# Patient Record
Sex: Male | Born: 1935 | Race: White | Hispanic: No | Marital: Married | Smoking: Never smoker
Health system: Southern US, Community
[De-identification: ages and names within clinical notes are randomized; demographics above are authoritative.]

## PROBLEM LIST (undated history)

## (undated) DIAGNOSIS — I5040 Unspecified combined systolic (congestive) and diastolic (congestive) heart failure: Secondary | ICD-10-CM

## (undated) DIAGNOSIS — R42 Dizziness and giddiness: Secondary | ICD-10-CM

## (undated) DIAGNOSIS — C9110 Chronic lymphocytic leukemia of B-cell type not having achieved remission: Secondary | ICD-10-CM

## (undated) DIAGNOSIS — E785 Hyperlipidemia, unspecified: Secondary | ICD-10-CM

## (undated) DIAGNOSIS — R809 Proteinuria, unspecified: Secondary | ICD-10-CM

## (undated) DIAGNOSIS — I351 Nonrheumatic aortic (valve) insufficiency: Secondary | ICD-10-CM

## (undated) DIAGNOSIS — M1990 Unspecified osteoarthritis, unspecified site: Secondary | ICD-10-CM

## (undated) DIAGNOSIS — C44601 Unspecified malignant neoplasm of skin of unspecified upper limb, including shoulder: Secondary | ICD-10-CM

## (undated) DIAGNOSIS — I714 Abdominal aortic aneurysm, without rupture, unspecified: Secondary | ICD-10-CM

## (undated) DIAGNOSIS — R109 Unspecified abdominal pain: Secondary | ICD-10-CM

## (undated) DIAGNOSIS — Z0000 Encounter for general adult medical examination without abnormal findings: Secondary | ICD-10-CM

## (undated) DIAGNOSIS — E291 Testicular hypofunction: Secondary | ICD-10-CM

## (undated) DIAGNOSIS — Z87438 Personal history of other diseases of male genital organs: Secondary | ICD-10-CM

## (undated) DIAGNOSIS — N183 Chronic kidney disease, stage 3 unspecified: Secondary | ICD-10-CM

## (undated) DIAGNOSIS — Z950 Presence of cardiac pacemaker: Secondary | ICD-10-CM

## (undated) DIAGNOSIS — I440 Atrioventricular block, first degree: Secondary | ICD-10-CM

## (undated) DIAGNOSIS — R197 Diarrhea, unspecified: Secondary | ICD-10-CM

## (undated) DIAGNOSIS — B349 Viral infection, unspecified: Secondary | ICD-10-CM

## (undated) DIAGNOSIS — N289 Disorder of kidney and ureter, unspecified: Secondary | ICD-10-CM

## (undated) DIAGNOSIS — I447 Left bundle-branch block, unspecified: Secondary | ICD-10-CM

## (undated) DIAGNOSIS — I4891 Unspecified atrial fibrillation: Secondary | ICD-10-CM

## (undated) DIAGNOSIS — C44300 Unspecified malignant neoplasm of skin of unspecified part of face: Secondary | ICD-10-CM

## (undated) DIAGNOSIS — E0500 Thyrotoxicosis with diffuse goiter without thyrotoxic crisis or storm: Secondary | ICD-10-CM

## (undated) DIAGNOSIS — R259 Unspecified abnormal involuntary movements: Secondary | ICD-10-CM

## (undated) DIAGNOSIS — G4700 Insomnia, unspecified: Secondary | ICD-10-CM

## (undated) DIAGNOSIS — M8580 Other specified disorders of bone density and structure, unspecified site: Secondary | ICD-10-CM

## (undated) DIAGNOSIS — I10 Essential (primary) hypertension: Secondary | ICD-10-CM

## (undated) DIAGNOSIS — R5383 Other fatigue: Secondary | ICD-10-CM

## (undated) DIAGNOSIS — R011 Cardiac murmur, unspecified: Secondary | ICD-10-CM

## (undated) HISTORY — DX: Unspecified atrial fibrillation: I48.91

## (undated) HISTORY — PX: OTHER SURGICAL HISTORY: SHX169

## (undated) HISTORY — PX: HERNIA REPAIR: SHX51

## (undated) HISTORY — DX: Presence of cardiac pacemaker: Z95.0

## (undated) HISTORY — PX: EYE SURGERY: SHX253

## (undated) HISTORY — DX: Unspecified combined systolic (congestive) and diastolic (congestive) heart failure: I50.40

---

## 2001-01-10 ENCOUNTER — Other Ambulatory Visit: Admission: RE | Admit: 2001-01-10 | Discharge: 2001-01-10 | Payer: Self-pay | Admitting: Oncology

## 2005-03-01 ENCOUNTER — Ambulatory Visit: Payer: Self-pay | Admitting: Oncology

## 2005-07-05 ENCOUNTER — Encounter: Admission: RE | Admit: 2005-07-05 | Discharge: 2005-07-05 | Payer: Self-pay | Admitting: Nephrology

## 2005-07-13 ENCOUNTER — Encounter: Admission: RE | Admit: 2005-07-13 | Discharge: 2005-07-13 | Payer: Self-pay | Admitting: Nephrology

## 2006-02-28 ENCOUNTER — Ambulatory Visit: Payer: Self-pay | Admitting: Oncology

## 2006-03-01 LAB — CBC WITH DIFFERENTIAL/PLATELET
Basophils Absolute: 0 10*3/uL (ref 0.0–0.1)
EOS%: 0.8 % (ref 0.0–7.0)
HGB: 11.9 g/dL — ABNORMAL LOW (ref 13.0–17.1)
MCH: 34.8 pg — ABNORMAL HIGH (ref 28.0–33.4)
MCV: 103.4 fL — ABNORMAL HIGH (ref 81.6–98.0)
MONO%: 3.4 % (ref 0.0–13.0)
RBC: 3.42 10*6/uL — ABNORMAL LOW (ref 4.20–5.71)
RDW: 13.5 % (ref 11.2–14.6)

## 2007-02-26 ENCOUNTER — Ambulatory Visit: Payer: Self-pay | Admitting: Oncology

## 2007-02-28 LAB — CBC WITH DIFFERENTIAL/PLATELET
BASO%: 0.3 % (ref 0.0–2.0)
EOS%: 0.8 % (ref 0.0–7.0)
Eosinophils Absolute: 0.2 10*3/uL (ref 0.0–0.5)
MCHC: 34.6 g/dL (ref 32.0–35.9)
MCV: 102.9 fL — ABNORMAL HIGH (ref 81.6–98.0)
MONO%: 3.7 % (ref 0.0–13.0)
NEUT#: 3 10*3/uL (ref 1.5–6.5)
RBC: 3.47 10*6/uL — ABNORMAL LOW (ref 4.20–5.71)
RDW: 13.4 % (ref 11.2–14.6)

## 2007-08-27 ENCOUNTER — Ambulatory Visit: Payer: Self-pay | Admitting: Oncology

## 2007-08-29 LAB — CBC & DIFF AND RETIC
BASO%: 0.2 % (ref 0.0–2.0)
Eosinophils Absolute: 0.1 10*3/uL (ref 0.0–0.5)
IRF: 0.38 (ref 0.070–0.380)
MONO#: 0.9 10*3/uL (ref 0.1–0.9)
NEUT#: 3.2 10*3/uL (ref 1.5–6.5)
Platelets: 150 10*3/uL (ref 145–400)
RBC: 3.61 10*6/uL — ABNORMAL LOW (ref 4.20–5.71)
RDW: 13.3 % (ref 11.2–14.6)
RETIC #: 49.8 10*3/uL (ref 31.8–103.9)
Retic %: 1.4 % (ref 0.7–2.3)
WBC: 24.3 10*3/uL — ABNORMAL HIGH (ref 4.0–10.0)
lymph#: 20 10*3/uL — ABNORMAL HIGH (ref 0.9–3.3)

## 2007-08-30 LAB — DIRECT ANTIGLOBULIN TEST (NOT AT ARMC)
DAT (Complement): NEGATIVE
DAT IgG: NEGATIVE

## 2007-11-25 ENCOUNTER — Ambulatory Visit: Payer: Self-pay | Admitting: Vascular Surgery

## 2008-04-20 ENCOUNTER — Ambulatory Visit (HOSPITAL_COMMUNITY): Admission: RE | Admit: 2008-04-20 | Discharge: 2008-04-20 | Payer: Self-pay | Admitting: Internal Medicine

## 2008-04-22 ENCOUNTER — Ambulatory Visit: Payer: Self-pay | Admitting: Oncology

## 2008-04-23 ENCOUNTER — Inpatient Hospital Stay (HOSPITAL_COMMUNITY): Admission: AD | Admit: 2008-04-23 | Discharge: 2008-04-25 | Payer: Self-pay | Admitting: Urology

## 2008-05-04 ENCOUNTER — Ambulatory Visit (HOSPITAL_BASED_OUTPATIENT_CLINIC_OR_DEPARTMENT_OTHER): Admission: RE | Admit: 2008-05-04 | Discharge: 2008-05-04 | Payer: Self-pay | Admitting: Urology

## 2008-06-07 ENCOUNTER — Ambulatory Visit: Payer: Self-pay | Admitting: Oncology

## 2008-06-08 LAB — CBC & DIFF AND RETIC
EOS%: 0.8 % (ref 0.0–7.0)
Eosinophils Absolute: 0.2 10*3/uL (ref 0.0–0.5)
LYMPH%: 79.6 % — ABNORMAL HIGH (ref 14.0–48.0)
MCH: 35.6 pg — ABNORMAL HIGH (ref 28.0–33.4)
MCHC: 33.8 g/dL (ref 32.0–35.9)
MCV: 105.4 fL — ABNORMAL HIGH (ref 81.6–98.0)
MONO%: 3.3 % (ref 0.0–13.0)
Platelets: 140 10*3/uL — ABNORMAL LOW (ref 145–400)
RBC: 3.4 10*6/uL — ABNORMAL LOW (ref 4.20–5.71)
Retic %: 1.5 % (ref 0.7–2.3)

## 2009-03-04 ENCOUNTER — Ambulatory Visit: Payer: Self-pay | Admitting: Oncology

## 2009-03-08 LAB — CBC WITH DIFFERENTIAL/PLATELET
BASO%: 0.1 % (ref 0.0–2.0)
Basophils Absolute: 0 10*3/uL (ref 0.0–0.1)
EOS%: 0.4 % (ref 0.0–7.0)
HCT: 35.6 % — ABNORMAL LOW (ref 38.4–49.9)
HGB: 12 g/dL — ABNORMAL LOW (ref 13.0–17.1)
LYMPH%: 85.2 % — ABNORMAL HIGH (ref 14.0–49.0)
MCH: 35.8 pg — ABNORMAL HIGH (ref 27.2–33.4)
MCHC: 33.7 g/dL (ref 32.0–36.0)
MCV: 106.1 fL — ABNORMAL HIGH (ref 79.3–98.0)
MONO%: 0.8 % (ref 0.0–14.0)
NEUT%: 13.5 % — ABNORMAL LOW (ref 39.0–75.0)

## 2009-12-07 ENCOUNTER — Ambulatory Visit: Payer: Self-pay | Admitting: Oncology

## 2009-12-09 ENCOUNTER — Encounter: Admission: RE | Admit: 2009-12-09 | Discharge: 2009-12-09 | Payer: Self-pay | Admitting: Cardiology

## 2009-12-14 ENCOUNTER — Ambulatory Visit: Payer: Self-pay | Admitting: Vascular Surgery

## 2010-02-04 ENCOUNTER — Ambulatory Visit: Payer: Self-pay | Admitting: Oncology

## 2010-02-08 LAB — CBC WITH DIFFERENTIAL/PLATELET
Eosinophils Absolute: 0.2 10*3/uL (ref 0.0–0.5)
HCT: 35.6 % — ABNORMAL LOW (ref 38.4–49.9)
LYMPH%: 83 % — ABNORMAL HIGH (ref 14.0–49.0)
MONO#: 0.7 10*3/uL (ref 0.1–0.9)
NEUT#: 3.3 10*3/uL (ref 1.5–6.5)
NEUT%: 13.4 % — ABNORMAL LOW (ref 39.0–75.0)
Platelets: 126 10*3/uL — ABNORMAL LOW (ref 140–400)
WBC: 24.9 10*3/uL — ABNORMAL HIGH (ref 4.0–10.3)
lymph#: 20.7 10*3/uL — ABNORMAL HIGH (ref 0.9–3.3)

## 2010-07-02 ENCOUNTER — Emergency Department (HOSPITAL_COMMUNITY): Admission: EM | Admit: 2010-07-02 | Discharge: 2010-07-03 | Payer: Self-pay | Admitting: Emergency Medicine

## 2010-09-12 ENCOUNTER — Ambulatory Visit
Admission: AD | Admit: 2010-09-12 | Discharge: 2010-09-12 | Payer: Self-pay | Source: Home / Self Care | Admitting: Internal Medicine

## 2010-11-29 ENCOUNTER — Ambulatory Visit (INDEPENDENT_AMBULATORY_CARE_PROVIDER_SITE_OTHER): Payer: Medicare PPO | Admitting: Vascular Surgery

## 2010-11-29 DIAGNOSIS — I714 Abdominal aortic aneurysm, without rupture: Secondary | ICD-10-CM

## 2010-12-02 NOTE — Consult Note (Signed)
NEW PATIENT CONSULTATION  Brian Underwood, Brian Underwood DOB:  1918/02/23                                       11/29/2010 CHART#:[id number]  Patient presents today for continued discussion regarding his infrarenal abdominal aortic aneurysm.  I had first met patient in 2006 when he had an incidental finding of a 3.5 cm infrarenal abdominal aortic aneurysm. He has had serial ultrasound follow-ups in our office.  He has had some continued growth over time, and today he is here for ultrasound follow- up.  Maximal diameter today is 5.0 cm.  He has had no symptoms referable to this.  He continues to be in excellent health despite his age of 72. He does not have any cardiac difficulty.  Does have a history of a cardiac murmur.  He is hypertensive under control.  He does not smoke.  SOCIAL HISTORY:  He is a retired Surveyor, minerals.  He does not smoke and is active.  FAMILY HISTORY:  Significant for a brain aneurysm with his brother.  REVIEW OF SYSTEMS:  Positive for a history of diverticulitis, history of renal stones, otherwise negative except for HPI.  PHYSICAL EXAMINATION:  A well-developed and well-nourished white male appearing his stated age in no acute distress.  Blood pressure is 161/67, pulse 51, respirations 12.  HEENT:  Normal.  Chest:  Clear bilateral without rales, rhonchi or wheezes.  Heart:  Regular rate and rhythm.  I do not appreciate a murmur.  He has 2+ radial, 2+ femoral, 2+ popliteal, and 2+ posterior tibial pulses.  Carotid arteries without bruits bilaterally.  Abdominal exam reveals a prominent aortic pulsation with no tenderness and no other masses.  Musculoskeletal shows no major deformity or cyanosis.  Neurologic:  No focal weakness or paresthesias. Skin without ulcers or rashes.  He did undergo an ultrasound today with a maximal diameter of 5.0 cm. He told me that he did have a CT scan in November for evaluation of renal stones.  I have reviewed these  images.  This did show a 5.20 cm infrarenal aortic aneurysm as well.  This was a not a CT angiogram.  He does have some calcifications.  It is difficult to determine his accurate length below the level of the renal arteries.  He may be a stent graft candidate.  He does have significant tortuosity of his iliac vessels as well.  I had a long discussion with patient, explaining the significance of his asymptomatic infrarenal aneurysm.  I have recommend he continue observation only.  We will see him at 69-month intervals with ultrasound. He is quite active despite his advanced age and would recommend consideration for repair if he reaches 5.5 to 6 cm.    Larina Earthly, M.D. Electronically Signed  TFE/MEDQ  D:  11/29/2010  T:  11/30/2010  Job:  [id number]  cc:   Loraine Leriche A. Perini, M.D.

## 2010-12-02 NOTE — Procedures (Unsigned)
DUPLEX ULTRASOUND OF ABDOMINAL AORTA  INDICATION:  Follow up abdominal aortic aneurysm.  HISTORY: Diabetes:  No. Cardiac:  Murmur. Hypertension:  Yes. Smoking:  No. Connective Tissue Disorder: Family History:  Brother had cerebral aneurysm. Previous Surgery:  No.  DUPLEX EXAM:         AP (cm)                   TRANSVERSE (cm) Proximal             2.27 cm Mid                  3.41 cm                   3.48 cm Distal               4.87 cm                   5.07 cm Right Iliac          1.63 cm Left Iliac           1.09 cm  PREVIOUS:  Date: 12/14/2009  AP:  4.31  TRANSVERSE:  4.18  IMPRESSION: 1. Abdominal aortic aneurysm with largest measurement of 4.87 cm X     5.07 cm, which is increased from the previous study. 2. Right iliac appears ectatic compared to left. 3. Aorta and common iliac artery velocities appear within normal     limits. 4. Incidental note:  Stable cystic structure on left measuring 8.51 cm     X 7.67 cm; however, unable to determine if single structure or     multiple. 5. Aorta and iliacs appear somewhat tortuous.    ___________________________________________ Larina Earthly, M.D.  AS/MEDQ  D:  11/29/2010  T:  11/29/2010  Job:  [id number]  cc:   Loraine Leriche A. Perini, M.D.

## 2011-02-16 ENCOUNTER — Emergency Department (HOSPITAL_COMMUNITY): Payer: Medicare PPO

## 2011-02-16 ENCOUNTER — Emergency Department (HOSPITAL_COMMUNITY)
Admission: EM | Admit: 2011-02-16 | Discharge: 2011-02-16 | Disposition: A | Discharge status: Home / Self Care | Payer: Medicare PPO | Attending: Emergency Medicine | Admitting: Emergency Medicine

## 2011-02-16 DIAGNOSIS — E7800 Pure hypercholesterolemia, unspecified: Secondary | ICD-10-CM | POA: Insufficient documentation

## 2011-02-16 DIAGNOSIS — D72829 Elevated white blood cell count, unspecified: Secondary | ICD-10-CM | POA: Insufficient documentation

## 2011-02-16 DIAGNOSIS — R109 Unspecified abdominal pain: Secondary | ICD-10-CM | POA: Insufficient documentation

## 2011-02-16 DIAGNOSIS — R9431 Abnormal electrocardiogram [ECG] [EKG]: Secondary | ICD-10-CM | POA: Insufficient documentation

## 2011-02-16 DIAGNOSIS — I714 Abdominal aortic aneurysm, without rupture, unspecified: Secondary | ICD-10-CM | POA: Insufficient documentation

## 2011-02-16 DIAGNOSIS — N200 Calculus of kidney: Secondary | ICD-10-CM | POA: Insufficient documentation

## 2011-02-16 DIAGNOSIS — I10 Essential (primary) hypertension: Secondary | ICD-10-CM | POA: Insufficient documentation

## 2011-02-16 DIAGNOSIS — Q619 Cystic kidney disease, unspecified: Secondary | ICD-10-CM | POA: Insufficient documentation

## 2011-02-16 DIAGNOSIS — N19 Unspecified kidney failure: Secondary | ICD-10-CM | POA: Insufficient documentation

## 2011-02-16 LAB — CBC
MCH: 35.5 pg — ABNORMAL HIGH (ref 26.0–34.0)
MCV: 104.1 fL — ABNORMAL HIGH (ref 78.0–100.0)
Platelets: 129 10*3/uL — ABNORMAL LOW (ref 150–400)
RBC: 3.38 MIL/uL — ABNORMAL LOW (ref 4.22–5.81)
RDW: 13.4 % (ref 11.5–15.5)

## 2011-02-16 LAB — COMPREHENSIVE METABOLIC PANEL
AST: 29 U/L (ref 0–37)
Albumin: 3.5 g/dL (ref 3.5–5.2)
BUN: 67 mg/dL — ABNORMAL HIGH (ref 6–23)
Calcium: 8.5 mg/dL (ref 8.4–10.5)
Chloride: 108 mEq/L (ref 96–112)
Creatinine, Ser: 2.3 mg/dL — ABNORMAL HIGH (ref 0.4–1.5)
GFR calc Af Amer: 32 mL/min — ABNORMAL LOW (ref >60)
Total Protein: 6 g/dL (ref 6.0–8.3)

## 2011-02-16 LAB — PATHOLOGIST SMEAR REVIEW

## 2011-02-16 LAB — DIFFERENTIAL
Basophils Absolute: 0 10*3/uL (ref 0.0–0.1)
Basophils Relative: 0 % (ref 0–1)
Eosinophils Absolute: 0.4 10*3/uL (ref 0.0–0.7)
Eosinophils Relative: 2 % (ref 0–5)
Metamyelocytes Relative: 0 %
Monocytes Absolute: 1.6 10*3/uL — ABNORMAL HIGH (ref 0.1–1.0)
Monocytes Relative: 8 % (ref 3–12)
Myelocytes: 0 %
Neutro Abs: 6.4 10*3/uL (ref 1.7–7.7)
Neutrophils Relative %: 31 % — ABNORMAL LOW (ref 43–77)

## 2011-02-16 LAB — URINE MICROSCOPIC-ADD ON

## 2011-02-16 LAB — URINALYSIS, ROUTINE W REFLEX MICROSCOPIC
Glucose, UA: NEGATIVE mg/dL
Hgb urine dipstick: NEGATIVE
Specific Gravity, Urine: 1.027 (ref 1.005–1.030)
pH: 5 (ref 5.0–8.0)

## 2011-02-16 LAB — POCT CARDIAC MARKERS
CKMB, poc: <1 ng/mL — ABNORMAL LOW (ref 1.0–8.0)
Troponin i, poc: <0.05 ng/mL (ref 0.00–0.09)

## 2011-02-17 LAB — URINE CULTURE
Colony Count: 15000
Culture  Setup Time: 201204261704

## 2011-03-07 NOTE — Procedures (Signed)
DUPLEX ULTRASOUND OF ABDOMINAL AORTA   INDICATION:  Followup, abdominal aortic aneurysm.   HISTORY:  Diabetes:  No.  Cardiac:  History of heart murmur.  Hypertension:  Yes, on medication.  Smoking:  No.  Connective Tissue Disorder:  Family History:  Brother was diagnosed with a cerebral aneurysm.  Previous Surgery:  No.   DUPLEX EXAM:         AP (cm)                   TRANSVERSE (cm)  Proximal             3.39 Cm                   3.63 cm  Mid                  3.74 cm                   3.88 cm  Distal               3.07 cm                   3.03 cm  Right Iliac          1.58 cm  Left Iliac           1.19 Cm   PREVIOUS:  Date: 08/31/06  AP:  3.52  TRANSVERSE:  3.75   IMPRESSION:  Slight increase in measurements of known abdominal aortic  aneurysm; however, aorta is tortuous, which could account for larger  measurements.   A preliminary copy was faxed to Dr. Laurey Morale office.   ___________________________________________  Larina Earthly, M.D.   DP/MEDQ  D:  11/25/2007  T:  11/25/2007  Job:  [id number]

## 2011-03-07 NOTE — Discharge Summary (Signed)
NAME:  Brian, Underwood                 ACCOUNT NO.:  [id number]   MEDICAL RECORD NO.:  [id number]          PATIENT TYPE:  INP   LOCATION:  1429                         FACILITY:  [facility]   PHYSICIAN:  Heloise Purpura, MD      DATE OF BIRTH:  [birth year]   DATE OF ADMISSION:  04/22/2008  DATE OF DISCHARGE:  04/25/2008                               DISCHARGE SUMMARY   ADMISSION DIAGNOSIS:  Right ureteral calculus.   DISCHARGE DIAGNOSIS:  Right ureteral calculus.   HISTORY AND PHYSICAL:  For full details, please see admission history  and physical.  Briefly, [name] is an 77 year old gentleman who  presented with right flank pain with radiation to his right upper  quadrant.  He was found to have a 5 mm right ureteral calculus.  Due to  persistent pain, as well as the development of renal insufficiency, it  was decided to proceed with treatment.  He was taken to the operating  room by Dr. Logan Bores and underwent attempted ureteroscopic removal of his  stone.  However, his stone could not be well visualized and ureteroscopy  proved to be difficult due to the small caliber of his ureter.  Therefore, a ureteral stent was placed.  The patient's serum creatinine  at that time was found to be 2.5.  He was monitored with subsequent  resolution of his renal insufficiency over the next 2 days.  By April 25, 2008, his creatinine had reach 1.3, which was consistent with his  baseline.  He also had leukocytosis with a white blood count of  approximately 20,000.  He was seen by his primary care physician, Dr.  Rodrigo Ran while hospitalized.  Dr. Waynard Edwards made note that the patient's  baseline white blood count was typically 17-20,000, related to his  chronic lymphocytic leukemia.  In addition, a differential on his CBC  was performed which showed predominantly lymphocytes.  This was  consistent with his CLL.  He remained afebrile and was felt to be stable  for discharge on April 25, 2008, and it was decided to  send him home on  fluoroquinolone antibiotics with a urine culture pending.   DISPOSITION:  Home.   DISCHARGE MEDICATIONS:  He was instructed to resume his regular home  medications.  In addition, he was given a prescription to take Vicodin  as needed for pain and given a prescription to begin Cipro 500 mg twice  daily for 1 week.  His also given a prescription to take Ditropan as  needed for bladder spasms related to his ureteral stent.   DISCHARGE INSTRUCTIONS:  He was instructed to call should he develop  high fever or worsening pain.   FOLLOW UP:  [name] did request to follow up with myself for further  management of his kidney stone.  He, therefore will be set up for follow-  up later this week to check a KUB and to follow up on the final culture  results from his urine culture in the hospital.  We will then proceed  with definitive management depending on the location of his  stone at  that time, as well as whether it is radiopaque.      Heloise Purpura, MD  Electronically Signed    LB/MEDQ  D:  04/25/2008  T:  04/25/2008  Job:  [id number]

## 2011-03-07 NOTE — Procedures (Signed)
DUPLEX ULTRASOUND OF ABDOMINAL AORTA   INDICATION:  Follow up abdominal aortic aneurysm.   HISTORY:  Diabetes:  No.  Cardiac:  Murmur.  Hypertension:  Yes.  Smoking:  No.  Connective Tissue Disorder:  Family History:  Brother died of cerebral aneurysm.  Previous Surgery:   DUPLEX EXAM:         AP (cm)                   TRANSVERSE (cm)  Proximal             3.21 cm                   3.33 cm  Mid                  4.31 cm                   4.18 cm  Distal               3.85 cm                   3.76 cm  Right Iliac          Not well visualized       Not well visualized  Left Iliac           Not well visualized       Not well visualized   PREVIOUS:  4.1 cm   IMPRESSION:  1. Abdominal aortic aneurysm noted with the largest measurements of      4.31 X 4.18 cm.  2. Multiple cystic structures (fluid collections) noted in the left      lower quadrant, measuring 8.27 cm X 7.50 cm and the second one      measuring 8.41 cm X 8.79 cm.  3. A cyst noted in the upper pole of the left kidney.   ___________________________________________  Quita Skye Hart [place], M.D.   MG/MEDQ  D:  12/14/2009  T:  12/15/2009  Job:  [id number]

## 2011-03-07 NOTE — Op Note (Signed)
NAME:  Brian Underwood, Brian Underwood                 ACCOUNT NO.:  [id number]   MEDICAL RECORD NO.:  [id number]          PATIENT TYPE:  OIB   LOCATION:  1610                         FACILITY:  [facility]   PHYSICIAN:  Jamison Neighbor, M.D.  DATE OF BIRTH:  1918-04-22   DATE OF PROCEDURE:  04/22/2008  DATE OF DISCHARGE:                               OPERATIVE REPORT   PREOPERATIVE DIAGNOSIS:  Right ureteral calculus.   POSTOPERATIVE DIAGNOSIS:  Right ureteral calculus.   PROCEDURE:  Cystoscopy, right retrograde, right ureteroscopy, right  double-J catheter.   SURGEON:  Jamison Neighbor, M.D.   ANESTHESIA:  General.   COMPLICATIONS:  None.   DRAINS:  A 6-French x 22-cm double-J catheter.   BRIEF HISTORY:  This 77 year old male was felt to have a 5 x 6-mm stone  in the right proximal ureter.  The patient has a lot of pain and would  like to have this removed.  He was offered ESWL but preferred a more  immediate treatment.  He understands that if the stone could not be  removed he will have a stent placed in preparation for eventual ESWL.  He gave full informed consent.   PROCEDURE:  After successful induction of general anesthesia, the  patient was placed in the dorsal lithotomy position, prepped with  Betadine and draped in the usual sterile fashion.  Cystoscopy was  performed. Urethra was visualized in its entirety and found to be  normal.  There was minimal prostatic enlargement and no real bladder  obstruction.  The bladder was carefully inspected.  No tumors or stones  could be seen.  Both ureteral orifices were normal in configuration and  location.  No stone was seen in the area of the right ureter.  The  retrograde study performed on the right-hand side was difficult to  interpret.  The ureter appeared dilated up into the proximal ureter, but  there was a lot of contrast from a CT scan that made it very difficult  to see the stone.  The contrast left over from the CT pretty much  obscured  the collecting system.  A guidewire was left up within the  collecting system.  Ureteroscope was advanced.  It was advanced all the  way as far as it would go, and the stone could not be identified.  The  ureter was noted to be dilated.  Incision was made to try and pass the  flexible ureteroscope further.  A ureteral access sheath was utilized.  A second wire was placed.  Ureteral access sheath was passed a second  time so that once safety wire was left outside.  The wire on the inside  of the sheath was used to pass a ureteroscope up towards the kidney.  This got into the area of the very proximal ureter, but stone was not  identified.  There was an area or two of clot, and there were some areas  that were difficult to evaluate.  It is possible a stone is trapped in  that area, but clear-cut stone could not be identified.  The  ureteroscope  was used to visualize the entire ureter, was withdrawn, and  no  stone could be identified.  The stone has either passed or it has passed  up into the kidney.  This will require additional x-ray for evaluation.  A double-J catheter will be left in place.  A follow-up image will be  obtained to see if there is a stone present.  The patient will be ESL if  the stone is still identifiable.      Jamison Neighbor, M.D.  Electronically Signed     RJE/MEDQ  D:  04/22/2008  T:  04/22/2008  Job:  [id number]

## 2011-03-07 NOTE — Op Note (Signed)
NAME:  Brian Underwood, Brian Underwood                 ACCOUNT NO.:  [id number]   MEDICAL RECORD NO.:  [id number]          PATIENT TYPE:  AMB   LOCATION:  NESC                         FACILITY:  [facility]   PHYSICIAN:  Heloise Purpura, MD      DATE OF BIRTH:  Aug 12, 1918   DATE OF PROCEDURE:  05/04/2008  DATE OF DISCHARGE:                               OPERATIVE REPORT   PREOPERATIVE DIAGNOSIS:  Right ureteral calculus.   POSTOPERATIVE DIAGNOSIS:  Right renal calculus.   PROCEDURES:  1. Cystoscopy.  2. Right retrograde pyelography.  3. Right ureteroscopy with laser lithotripsy and stone removal.  4. Right ureteral stent placement (6 x 26).   SURGEON:  Heloise Purpura, MD.   ANESTHESIA:  General anesthesia.   COMPLICATIONS:  None.   INDICATIONS FOR PROCEDURE:  Mr. Surette is an 77 year old gentleman who  recently presented with right-sided flank pain and an elevated  creatinine.  He underwent an attempted ureteroscopic stone removal  procedure which was unsuccessful.  However, a ureteral stent was placed  with subsequent normalization of his creatinine.  He now presents for  definitive stone management.  He recently underwent a KUB which  demonstrated a questionable ureteral calculus overlying the pelvis in  the mid ureter.  He also had a large calcification over the right renal  contour measuring approximately 1.7 cm which was inconsistent with a 6  mm stone noted on his prior CT scan.  After discussion regarding  potential risks, benefits, alternative options and the potential  complications associated with the above procedures, he elected to  proceed and informed consent was obtained.   DESCRIPTION OF PROCEDURE:  The patient was taken to the operating room  and a general anesthetic was administered.  He was given preoperative  antibiotics, placed in the dorsal lithotomy position and prepped and  draped in the usual sterile fashion.  Next, a preoperative time-out was  performed.  Cystourethroscopy was  then performed which demonstrated the  patient's indwelling right ureteral stent.  No other bladder tumors,  stones, or other mucosal pathology was identified.  The right ureteral  stent was then brought out to the urethral meatus and a 0.038 Sensor  guidewire was advanced up the stent and into the right renal pelvis  under fluoroscopic guidance.  A 6 French ureteral catheter was then  advanced over the wire and a retrograde pyelogram was performed.  No  obvious filling defects were noted within the ureter, nor the right  renal pelvis.  Certainly, no large filling defects were identified that  would be consistent with a 1.7 cm renal calculus.  At this point, the  wire was replaced and semi-rigid ureteroscopy was performed up to and  above the iliac vessels.  No identifiable stone was visualized.  Therefore, a second 0.038 Glidewire was advanced up into the renal  pelvis and the digital flexible ureteroscope was advanced up into the  kidney.  All calyces were visualized and the patient's solitary 6 mm  stone was identified.  No other calculi were seen.  This stone was  grasped with a Nitanol basket and  brought down into the distal ureter.  Due to the size of the stone, it was felt that it would be unlikely to  be safely brought out the ureterovesical junction.  Therefore, the stone  was released and fragmented with the Holmium laser.  Once adequately  fragmented, the fragments were removed with the Nitanol basket until all  sizeable fragments were removed.  A 6 x 26 double J ureteral stent was  then advanced over the wire using Seldinger technique and appropriately  positioned in the renal pelvis and bladder under fluoroscopic guidance.  The distal curl was confirmed under cystoscopic guidance and the  patient's bladder was emptied.  The procedure was ended.  He tolerated  the procedure well without complications.  He was able to be awakened  and transferred to the recovery unit in  satisfactory condition.      Heloise Purpura, MD  Electronically Signed     LB/MEDQ  D:  05/04/2008  T:  05/04/2008  Job:  [id number]

## 2011-03-09 ENCOUNTER — Encounter (HOSPITAL_BASED_OUTPATIENT_CLINIC_OR_DEPARTMENT_OTHER): Payer: Medicare PPO | Admitting: Oncology

## 2011-03-09 ENCOUNTER — Other Ambulatory Visit: Payer: Self-pay | Admitting: Oncology

## 2011-03-09 DIAGNOSIS — C9110 Chronic lymphocytic leukemia of B-cell type not having achieved remission: Secondary | ICD-10-CM

## 2011-03-09 DIAGNOSIS — D649 Anemia, unspecified: Secondary | ICD-10-CM

## 2011-03-09 LAB — CBC WITH DIFFERENTIAL/PLATELET
Eosinophils Absolute: 0.2 10*3/uL (ref 0.0–0.5)
HCT: 32.5 % — ABNORMAL LOW (ref 38.4–49.9)
LYMPH%: 83.3 % — ABNORMAL HIGH (ref 14.0–49.0)
MCHC: 33.4 g/dL (ref 32.0–36.0)
MCV: 107.3 fL — ABNORMAL HIGH (ref 79.3–98.0)
MONO#: 0.5 10*3/uL (ref 0.1–0.9)
MONO%: 2.7 % (ref 0.0–14.0)
NEUT#: 2.4 10*3/uL (ref 1.5–6.5)
NEUT%: 12.7 % — ABNORMAL LOW (ref 39.0–75.0)
Platelets: 118 10*3/uL — ABNORMAL LOW (ref 140–400)
RBC: 3.03 10*6/uL — ABNORMAL LOW (ref 4.20–5.82)
WBC: 18.8 10*3/uL — ABNORMAL HIGH (ref 4.0–10.3)

## 2011-05-31 ENCOUNTER — Encounter (INDEPENDENT_AMBULATORY_CARE_PROVIDER_SITE_OTHER): Payer: Medicare PPO

## 2011-05-31 DIAGNOSIS — I719 Aortic aneurysm of unspecified site, without rupture: Secondary | ICD-10-CM

## 2011-06-16 NOTE — Procedures (Unsigned)
DUPLEX ULTRASOUND OF ABDOMINAL AORTA  INDICATION:  Abdominal aortic aneurysm.  HISTORY: Diabetes:  No. Cardiac:  Murmur. Hypertension:  Yes. Smoking:  No. Connective Tissue Disorder: Family History:  Brother had cerebral aneurysm. Previous Surgery:  No.  DUPLEX EXAM:         AP (cm)                   TRANSVERSE (cm) Proximal             3.1 cm                    2.7 cm Mid                  3.2 cm                    3.4 cm Distal               5.1 cm                    4.7 cm Right Iliac          1.5 cm                    1.5 cm Left Iliac           1.6 cm                    1.6 cm  PREVIOUS:  Date: 11/29/2010  AP:  4.87  TRANSVERSE:  5.07  IMPRESSION: 1. Aneurysmal dilatation of the abdominal aorta with no significant     change in maximal diameter when compared to the previous     examination. 2. A tortuous abdominal aorta and bilateral common iliac arteries is     noted. 3. Incidental finding:  Two cystic structures, with no internal flow,     noted anteriorly to the inferior vena cava and abdominal aorta     measuring roughly 9 cm each.  ___________________________________________ Larina Earthly, M.D.  CH/MEDQ  D:  05/31/2011  T:  05/31/2011  Job:  [id number]

## 2011-07-20 LAB — BASIC METABOLIC PANEL
BUN: 21
BUN: 21
BUN: 31 — ABNORMAL HIGH
BUN: 38 — ABNORMAL HIGH
CO2: 25
Calcium: 8.5
Calcium: 9.3
Chloride: 108
Chloride: 108
Creatinine, Ser: 1.31
Creatinine, Ser: 1.33
Creatinine, Ser: 2.54 — ABNORMAL HIGH
GFR calc Af Amer: 35 — ABNORMAL LOW
GFR calc non Af Amer: 24 — ABNORMAL LOW
GFR calc non Af Amer: 29 — ABNORMAL LOW
GFR calc non Af Amer: 52 — ABNORMAL LOW
Glucose, Bld: 101 — ABNORMAL HIGH
Glucose, Bld: 104 — ABNORMAL HIGH
Glucose, Bld: 106 — ABNORMAL HIGH
Glucose, Bld: 111 — ABNORMAL HIGH
Glucose, Bld: 94
Potassium: 4
Potassium: 4.3
Potassium: 4.4
Potassium: 4.4
Sodium: 137
Sodium: 141

## 2011-07-20 LAB — CBC
HCT: 29.4 — ABNORMAL LOW
HCT: 29.6 — ABNORMAL LOW
HCT: 30.4 — ABNORMAL LOW
Hemoglobin: 10 — ABNORMAL LOW
Hemoglobin: 10.8 — ABNORMAL LOW
Hemoglobin: 9.8 — ABNORMAL LOW
MCHC: 33.7
MCHC: 33.7
MCV: 104.8 — ABNORMAL HIGH
MCV: 105.1 — ABNORMAL HIGH
Platelets: 110 — ABNORMAL LOW
Platelets: 129 — ABNORMAL LOW
RBC: 2.77 — ABNORMAL LOW
RDW: 13.5
RDW: 13.7
RDW: 13.9
RDW: 13.9
WBC: 19.7 — ABNORMAL HIGH

## 2011-07-20 LAB — URINALYSIS, ROUTINE W REFLEX MICROSCOPIC
Bilirubin Urine: NEGATIVE
Ketones, ur: NEGATIVE
Nitrite: NEGATIVE
Specific Gravity, Urine: 1.016
Urobilinogen, UA: 0.2
pH: 5.5

## 2011-07-20 LAB — URINE CULTURE
Culture: NO GROWTH
Special Requests: NEGATIVE

## 2011-07-20 LAB — DIFFERENTIAL
Basophils Absolute: 0
Basophils Absolute: 0.1
Eosinophils Absolute: 0.2
Lymphs Abs: 15.7 — ABNORMAL HIGH
Lymphs Abs: 16.9 — ABNORMAL HIGH
Monocytes Absolute: 0.5
Monocytes Absolute: 0.6
Neutro Abs: 2.7
Neutro Abs: 2.9

## 2011-07-20 LAB — URINE MICROSCOPIC-ADD ON

## 2011-07-20 LAB — POCT I-STAT 4, (NA,K, GLUC, HGB,HCT): Glucose, Bld: 103 — ABNORMAL HIGH

## 2011-07-28 ENCOUNTER — Other Ambulatory Visit: Payer: Self-pay | Admitting: Oncology

## 2011-07-28 ENCOUNTER — Encounter (HOSPITAL_BASED_OUTPATIENT_CLINIC_OR_DEPARTMENT_OTHER): Payer: Medicare PPO | Admitting: Oncology

## 2011-07-28 DIAGNOSIS — C9110 Chronic lymphocytic leukemia of B-cell type not having achieved remission: Secondary | ICD-10-CM

## 2011-07-28 LAB — CBC WITH DIFFERENTIAL/PLATELET
Basophils Absolute: 0.1 10*3/uL (ref 0.0–0.1)
Eosinophils Absolute: 0.2 10*3/uL (ref 0.0–0.5)
HGB: 12.6 g/dL — ABNORMAL LOW (ref 13.0–17.1)
LYMPH%: 78.6 % — ABNORMAL HIGH (ref 14.0–49.0)
MONO#: 0.5 10*3/uL (ref 0.1–0.9)
NEUT#: 4.7 10*3/uL (ref 1.5–6.5)
Platelets: 123 10*3/uL — ABNORMAL LOW (ref 140–400)
RBC: 3.62 10*6/uL — ABNORMAL LOW (ref 4.20–5.82)
WBC: 25.7 10*3/uL — ABNORMAL HIGH (ref 4.0–10.3)
nRBC: 0 % (ref 0–0)

## 2011-09-23 ENCOUNTER — Telehealth: Payer: Self-pay | Admitting: Oncology

## 2011-09-23 NOTE — Telephone Encounter (Signed)
S/w the pt's wife regarding the may 2013 appts

## 2011-11-15 ENCOUNTER — Encounter (HOSPITAL_COMMUNITY): Payer: Self-pay | Admitting: *Deleted

## 2011-11-15 ENCOUNTER — Other Ambulatory Visit: Payer: Self-pay

## 2011-11-15 ENCOUNTER — Inpatient Hospital Stay (HOSPITAL_COMMUNITY)
Admission: EM | Admit: 2011-11-15 | Discharge: 2011-11-19 | DRG: 309 | Disposition: A | Discharge status: Home / Self Care | Payer: Medicare PPO | Attending: Internal Medicine | Admitting: Internal Medicine

## 2011-11-15 ENCOUNTER — Emergency Department (HOSPITAL_COMMUNITY): Payer: Medicare PPO

## 2011-11-15 DIAGNOSIS — I5030 Unspecified diastolic (congestive) heart failure: Secondary | ICD-10-CM | POA: Diagnosis present

## 2011-11-15 DIAGNOSIS — I351 Nonrheumatic aortic (valve) insufficiency: Secondary | ICD-10-CM | POA: Diagnosis present

## 2011-11-15 DIAGNOSIS — I2789 Other specified pulmonary heart diseases: Secondary | ICD-10-CM | POA: Diagnosis present

## 2011-11-15 DIAGNOSIS — I359 Nonrheumatic aortic valve disorder, unspecified: Secondary | ICD-10-CM | POA: Diagnosis present

## 2011-11-15 DIAGNOSIS — I509 Heart failure, unspecified: Secondary | ICD-10-CM | POA: Diagnosis present

## 2011-11-15 DIAGNOSIS — I4891 Unspecified atrial fibrillation: Principal | ICD-10-CM | POA: Diagnosis present

## 2011-11-15 DIAGNOSIS — K5730 Diverticulosis of large intestine without perforation or abscess without bleeding: Secondary | ICD-10-CM | POA: Diagnosis present

## 2011-11-15 DIAGNOSIS — IMO0001 Reserved for inherently not codable concepts without codable children: Secondary | ICD-10-CM | POA: Diagnosis present

## 2011-11-15 DIAGNOSIS — I10 Essential (primary) hypertension: Secondary | ICD-10-CM | POA: Diagnosis present

## 2011-11-15 DIAGNOSIS — I447 Left bundle-branch block, unspecified: Secondary | ICD-10-CM | POA: Diagnosis present

## 2011-11-15 DIAGNOSIS — I714 Abdominal aortic aneurysm, without rupture, unspecified: Secondary | ICD-10-CM | POA: Diagnosis present

## 2011-11-15 DIAGNOSIS — I498 Other specified cardiac arrhythmias: Secondary | ICD-10-CM | POA: Diagnosis present

## 2011-11-15 DIAGNOSIS — N183 Chronic kidney disease, stage 3 unspecified: Secondary | ICD-10-CM | POA: Diagnosis present

## 2011-11-15 DIAGNOSIS — N200 Calculus of kidney: Secondary | ICD-10-CM | POA: Diagnosis present

## 2011-11-15 DIAGNOSIS — R109 Unspecified abdominal pain: Secondary | ICD-10-CM | POA: Diagnosis present

## 2011-11-15 DIAGNOSIS — C9110 Chronic lymphocytic leukemia of B-cell type not having achieved remission: Secondary | ICD-10-CM | POA: Diagnosis present

## 2011-11-15 DIAGNOSIS — N400 Enlarged prostate without lower urinary tract symptoms: Secondary | ICD-10-CM | POA: Diagnosis present

## 2011-11-15 DIAGNOSIS — R001 Bradycardia, unspecified: Secondary | ICD-10-CM | POA: Diagnosis not present

## 2011-11-15 DIAGNOSIS — K5790 Diverticulosis of intestine, part unspecified, without perforation or abscess without bleeding: Secondary | ICD-10-CM | POA: Diagnosis present

## 2011-11-15 DIAGNOSIS — I129 Hypertensive chronic kidney disease with stage 1 through stage 4 chronic kidney disease, or unspecified chronic kidney disease: Secondary | ICD-10-CM | POA: Diagnosis present

## 2011-11-15 DIAGNOSIS — E0500 Thyrotoxicosis with diffuse goiter without thyrotoxic crisis or storm: Secondary | ICD-10-CM | POA: Diagnosis present

## 2011-11-15 HISTORY — DX: Proteinuria, unspecified: R80.9

## 2011-11-15 HISTORY — DX: Essential (primary) hypertension: I10

## 2011-11-15 HISTORY — DX: Thyrotoxicosis with diffuse goiter without thyrotoxic crisis or storm: E05.00

## 2011-11-15 HISTORY — DX: Viral infection, unspecified: B34.9

## 2011-11-15 HISTORY — DX: Encounter for general adult medical examination without abnormal findings: Z00.00

## 2011-11-15 HISTORY — DX: Unspecified malignant neoplasm of skin of unspecified upper limb, including shoulder: C44.601

## 2011-11-15 HISTORY — DX: Left bundle-branch block, unspecified: I44.7

## 2011-11-15 HISTORY — DX: Insomnia, unspecified: G47.00

## 2011-11-15 HISTORY — DX: Chronic kidney disease, stage 3 unspecified: N18.30

## 2011-11-15 HISTORY — DX: Chronic lymphocytic leukemia of B-cell type not having achieved remission: C91.10

## 2011-11-15 HISTORY — DX: Diarrhea, unspecified: R19.7

## 2011-11-15 HISTORY — DX: Nonrheumatic aortic (valve) insufficiency: I35.1

## 2011-11-15 HISTORY — DX: Abdominal aortic aneurysm, without rupture, unspecified: I71.40

## 2011-11-15 HISTORY — DX: Abdominal aortic aneurysm, without rupture: I71.4

## 2011-11-15 HISTORY — DX: Other fatigue: R53.83

## 2011-11-15 HISTORY — DX: Atrioventricular block, first degree: I44.0

## 2011-11-15 HISTORY — DX: Chronic kidney disease, stage 3 (moderate): N18.3

## 2011-11-15 HISTORY — DX: Other specified disorders of bone density and structure, unspecified site: M85.80

## 2011-11-15 HISTORY — DX: Unspecified abnormal involuntary movements: R25.9

## 2011-11-15 HISTORY — DX: Cardiac murmur, unspecified: R01.1

## 2011-11-15 HISTORY — DX: Testicular hypofunction: E29.1

## 2011-11-15 HISTORY — DX: Personal history of other diseases of male genital organs: Z87.438

## 2011-11-15 HISTORY — DX: Unspecified osteoarthritis, unspecified site: M19.90

## 2011-11-15 HISTORY — DX: Disorder of kidney and ureter, unspecified: N28.9

## 2011-11-15 HISTORY — DX: Dizziness and giddiness: R42

## 2011-11-15 HISTORY — DX: Unspecified abdominal pain: R10.9

## 2011-11-15 HISTORY — DX: Unspecified malignant neoplasm of skin of unspecified part of face: C44.300

## 2011-11-15 HISTORY — DX: Hyperlipidemia, unspecified: E78.5

## 2011-11-15 LAB — MRSA PCR SCREENING: MRSA by PCR: NEGATIVE

## 2011-11-15 LAB — URINALYSIS, ROUTINE W REFLEX MICROSCOPIC
Bilirubin Urine: NEGATIVE
Glucose, UA: NEGATIVE mg/dL
Hgb urine dipstick: NEGATIVE
Ketones, ur: NEGATIVE mg/dL
Leukocytes, UA: NEGATIVE
Nitrite: NEGATIVE
Protein, ur: 30 mg/dL — AB
Specific Gravity, Urine: 1.021 (ref 1.005–1.030)
Urobilinogen, UA: 0.2 mg/dL (ref 0.0–1.0)
pH: 6 (ref 5.0–8.0)

## 2011-11-15 LAB — COMPREHENSIVE METABOLIC PANEL
Alkaline Phosphatase: 74 U/L (ref 39–117)
BUN: 37 mg/dL — ABNORMAL HIGH (ref 6–23)
Chloride: 106 mEq/L (ref 96–112)
GFR calc Af Amer: 41 mL/min — ABNORMAL LOW (ref >90)
GFR calc non Af Amer: 36 mL/min — ABNORMAL LOW (ref >90)
Glucose, Bld: 90 mg/dL (ref 70–99)
Potassium: 4.3 mEq/L (ref 3.5–5.1)
Total Bilirubin: 0.4 mg/dL (ref 0.3–1.2)

## 2011-11-15 LAB — CBC
HCT: 39.5 % (ref 39.0–52.0)
Hemoglobin: 13 g/dL (ref 13.0–17.0)
WBC: 18.3 10*3/uL — ABNORMAL HIGH (ref 4.0–10.5)

## 2011-11-15 LAB — TROPONIN I
Troponin I: <0.3 ng/mL (ref <0.30)
Troponin I: <0.3 ng/mL (ref <0.30)

## 2011-11-15 LAB — URINE MICROSCOPIC-ADD ON

## 2011-11-15 LAB — DIFFERENTIAL
Basophils Absolute: 0 10*3/uL (ref 0.0–0.1)
Lymphocytes Relative: 78 % — ABNORMAL HIGH (ref 12–46)
Monocytes Absolute: 0.4 10*3/uL (ref 0.1–1.0)
Monocytes Relative: 2 % — ABNORMAL LOW (ref 3–12)
Neutro Abs: 3.7 10*3/uL (ref 1.7–7.7)

## 2011-11-15 LAB — MAGNESIUM: Magnesium: 1.9 mg/dL (ref 1.5–2.5)

## 2011-11-15 MED ORDER — AMLODIPINE BESYLATE 2.5 MG PO TABS
2.5000 mg | ORAL_TABLET | ORAL | Status: AC
  Administered 2011-11-15: 2.5 mg via ORAL
  Filled 2011-11-15: qty 1

## 2011-11-15 MED ORDER — SODIUM CHLORIDE 0.9 % IJ SOLN
3.0000 mL | Freq: Two times a day (BID) | INTRAMUSCULAR | Status: DC
  Administered 2011-11-15 – 2011-11-19 (×8): 3 mL via INTRAVENOUS

## 2011-11-15 MED ORDER — POTASSIUM CHLORIDE ER 10 MEQ PO TBCR
10.0000 meq | EXTENDED_RELEASE_TABLET | Freq: Two times a day (BID) | ORAL | Status: DC
  Administered 2011-11-16 – 2011-11-19 (×8): 10 meq via ORAL
  Filled 2011-11-15 (×11): qty 1

## 2011-11-15 MED ORDER — ZOLPIDEM TARTRATE 5 MG PO TABS: 5.0000 mg | ORAL_TABLET | Freq: Every evening | ORAL | Status: DC | PRN

## 2011-11-15 MED ORDER — VITAMIN D3 25 MCG (1000 UNIT) PO TABS
2000.0000 [IU] | ORAL_TABLET | Freq: Every day | ORAL | Status: DC
  Administered 2011-11-16 – 2011-11-19 (×4): 2000 [IU] via ORAL
  Filled 2011-11-15 (×4): qty 2

## 2011-11-15 MED ORDER — CHLORTHALIDONE 25 MG PO TABS
25.0000 mg | ORAL_TABLET | Freq: Every day | ORAL | Status: DC
Start: 2011-11-16 — End: 2011-11-19
  Administered 2011-11-17 – 2011-11-19 (×3): 25 mg via ORAL
  Filled 2011-11-15 (×4): qty 1

## 2011-11-15 MED ORDER — ACETAMINOPHEN 325 MG PO TABS
650.0000 mg | ORAL_TABLET | Freq: Four times a day (QID) | ORAL | Status: DC | PRN
  Administered 2011-11-15 – 2011-11-19 (×9): 650 mg via ORAL
  Filled 2011-11-15 (×2): qty 2
  Filled 2011-11-15: qty 1
  Filled 2011-11-15 (×6): qty 2

## 2011-11-15 MED ORDER — AMLODIPINE BESYLATE 2.5 MG PO TABS
2.5000 mg | ORAL_TABLET | Freq: Every day | ORAL | Status: DC
  Administered 2011-11-17 – 2011-11-19 (×3): 2.5 mg via ORAL
  Filled 2011-11-15 (×4): qty 1

## 2011-11-15 MED ORDER — EZETIMIBE-SIMVASTATIN 10-20 MG PO TABS
0.5000 | ORAL_TABLET | Freq: Every day | ORAL | Status: DC
  Administered 2011-11-16 – 2011-11-18 (×3): 0.5 via ORAL
  Filled 2011-11-15 (×7): qty 0.5

## 2011-11-15 NOTE — Consult Note (Signed)
Reason for Consult: Atrial Fibrilation  Requesting Physician: Dr Waynard Edwards  HPI: This is a 78 y.o. male with a past medical history significant for chronic left bundle branch block and sinus bradycardia. There is no history of coronary disease. He has had a history of vascular disease, he has an abdominal aortic aneurysm that is followed by Dr. Arbie Cookey. His last abdominal ultrasound showed his aneurysm to be 5.1 x 4.7 cm. The patient says he has a history of valvular heart disease, he has not had an echocardiogram in over 4 years. He presented to Dr. Laurey Morale office today complaining of abdominal pain and vague shortness of breath. EKG at Dr. Laurey Morale office showed atrial fibrillation with a ventricular response of about 60 with chronic left bundle branch block and some PVCs. He was sent to the emergency room for further evaluation of his abdominal pain. We're asked to see him in consult regarding a new onset of atrial fibrillation. The last EKG we could find showing sinus rhythm was in April of 2012. He seems to be relatively asymptomatic regarding his atrial fibrillation, he denies any chest pain or palpitations. He is very active for his age, he still works. He walks up 18 steps every day to his office without significant problem.  PMHx:  Past Medical History  Diagnosis Date  . Renal disorder   . Abnormal involuntary movements   . Viral infection   . Hypogonadism male   . Fatigue   . Orthostatic lightheadedness   . Acute diarrhea   . Abdominal pain of unknown etiology   . Osteopenia   . Microalbuminuria   . Left bundle branch block   . Atrioventricular block, first degree   . Aortic insufficiency   . Abdominal aortic aneurysm   . History of benign prostatic hypertrophy   . Chronic renal disease, stage III   . Graves disease   . Hyperlipidemia   . Insomnia   . Health maintenance examination   . CLL (chronic lymphoblastic leukemia)    Past Surgical History  Procedure Date  . Hernia  repair   . Right foot     FAMHx: Family History  Problem Relation Age of Onset  . Stroke Mother   . Stroke Father   . Cancer Brother     SOCHx:  reports that he has never smoked. He does not have any smokeless tobacco history on file. He reports that he drinks alcohol. 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  ALLERGIES: Allergies  Allergen Reactions  . Altace   . Aspirin   . Lexapro     ROS: no chest pain, no palpatations, no syncope. Pos for abd pain, skin cancers, CLL. H/O heart murrmur. No fevers, chills or night sweats.  HOME MEDICATIONS:  (Not in a hospital admission)  HOSPITAL MEDICATIONS: Prior to Admission:  (Not in a hospital admission)  VITALS: Blood pressure 163/64, pulse 54, temperature 97.8 F (36.6 C), temperature source Oral, resp. rate 23, SpO2 96.00%.  PHYSICAL EXAM: General appearance: alert, cooperative and no distress Neck: no adenopathy, no carotid bruit, no JVD, supple, symmetrical, trachea midline and thyroid not enlarged, symmetric, no tenderness/mass/nodules Lungs: decreased Heart: irregularly irregular rhythm and 1/6 syst murmur Abdomen: non tender Extremities: venous stasis dermatitis noted, trace edema Pulses: 2+ and symmetric Skin: Skin color, texture, turgor normal. No rashes or lesions or skin cancers Neurologic: Grossly normal  LABS: Results for orders placed during the hospital encounter of 11/15/11 (from the past 48  hour(s))  CBC     Status: Abnormal   Collection Time   11/15/11  4:44 PM      Component Value Range Comment   WBC 18.3 (*) 4.0 - 10.5 (K/uL)    RBC 3.70 (*) 4.22 - 5.81 (MIL/uL)    Hemoglobin 13.0  13.0 - 17.0 (g/dL)    HCT 62.9  52.8 - 41.3 (%)    MCV 106.8 (*) 78.0 - 100.0 (fL)    MCH 35.1 (*) 26.0 - 34.0 (pg)    MCHC 32.9  30.0 - 36.0 (g/dL)    RDW 24.4  01.0 - 27.2 (%)    Platelets 95 (*) 150 - 400 (K/uL) PLATELET COUNT CONFIRMED BY SMEAR  COMPREHENSIVE METABOLIC  PANEL     Status: Abnormal   Collection Time   11/15/11  4:44 PM      Component Value Range Comment   Sodium 142  135 - 145 (mEq/L)    Potassium 4.3  3.5 - 5.1 (mEq/L)    Chloride 106  96 - 112 (mEq/L)    CO2 26  19 - 32 (mEq/L)    Glucose, Bld 90  70 - 99 (mg/dL)    BUN 37 (*) 6 - 23 (mg/dL)    Creatinine, Ser 5.36 (*) 0.50 - 1.35 (mg/dL)    Calcium 8.8  8.4 - 10.5 (mg/dL)    Total Protein 6.1  6.0 - 8.3 (g/dL)    Albumin 3.4 (*) 3.5 - 5.2 (g/dL)    AST 39 (*) 0 - 37 (U/L)    ALT 32  0 - 53 (U/L)    Alkaline Phosphatase 74  39 - 117 (U/L)    Total Bilirubin 0.4  0.3 - 1.2 (mg/dL)    GFR calc non Af Amer 36 (*) >90 (mL/min)    GFR calc Af Amer 41 (*) >90 (mL/min)   TROPONIN I     Status: Normal   Collection Time   11/15/11  4:44 PM      Component Value Range Comment   Troponin I <0.30  <0.30 (ng/mL)   DIFFERENTIAL     Status: Abnormal   Collection Time   11/15/11  4:44 PM      Component Value Range Comment   Neutrophils Relative 19 (*) 43 - 77 (%)    Neutro Abs 3.7  1.7 - 7.7 (K/uL)    Lymphocytes Relative 78 (*) 12 - 46 (%)    Lymphs Abs 14.7 (*) 0.7 - 4.0 (K/uL)    Monocytes Relative 2 (*) 3 - 12 (%)    Monocytes Absolute 0.4  0.1 - 1.0 (K/uL)    Eosinophils Relative 1  0 - 5 (%)    Eosinophils Absolute 0.2  0.0 - 0.7 (K/uL)    Basophils Relative 0  0 - 1 (%)    Basophils Absolute 0.0  0.0 - 0.1 (K/uL)    WBC Morphology ATYPICAL LYMPHOCYTES   ABSOLUTE LYMPHOCYTOSIS  URINALYSIS, ROUTINE W REFLEX MICROSCOPIC     Status: Abnormal   Collection Time   11/15/11  5:11 PM      Component Value Range Comment   Color, Urine YELLOW  YELLOW     APPearance CLEAR  CLEAR     Specific Gravity, Urine 1.021  1.005 - 1.030     pH 6.0  5.0 - 8.0     Glucose, UA NEGATIVE  NEGATIVE (mg/dL)    Hgb urine dipstick NEGATIVE  NEGATIVE     Bilirubin Urine NEGATIVE  NEGATIVE  Ketones, ur NEGATIVE  NEGATIVE (mg/dL)    Protein, ur 30 (*) NEGATIVE (mg/dL)    Urobilinogen, UA 0.2  0.0 - 1.0  (mg/dL)    Nitrite NEGATIVE  NEGATIVE     Leukocytes, UA NEGATIVE  NEGATIVE    URINE MICROSCOPIC-ADD ON     Status: Normal   Collection Time   11/15/11  5:11 PM      Component Value Range Comment   Squamous Epithelial / LPF RARE  RARE     WBC, UA 0-2  <3 (WBC/hpf)    RBC / HPF 0-2  <3 (RBC/hpf)     IMAGING: No results found.  IMPRESSION:  Principal Problem:  *Abdominal pain  Active Problems:  AF (atrial fibrillation)  LBBB (left bundle branch block)  Sinus bradycardia  Diverticulosis  AAA (abdominal aortic aneurysm), 5.1 X 4.7  Nephrolithiasis  CLL (chronic lymphoblastic leukemia)  AI (aortic insufficiency), 2D 2003  HTN   RECOMMENDATION: Telemetry, MD to see.Check 2D, check enzymes, serial EKGs.  Time Spent Directly with Patient: 45 minutes  KILROY,LUKE K 11/15/2011, 6:34 PM      Patient seen and examined. Agree with assessment and plan. Pt denies any significant h/o CAD.  He has had LBBB on prior ECG's with first degree AV block.  ECG today shows A Fib with slow ventricular response/LBBB for which pt was unaware. There is a history of "free bleeding" with ASA. Presently will initiate heparin to reduce potential thromboembolic risk. Doubt abd pain due to embolus, but with A Fib this is in DDX.  Suspect sick sinus syndrome, and if not a coumadin candidate may benefit from Plavix.  Will f/u 2-d echo, cardiac enzymes, and ECG.  Will add amlodipine initially at 2.5 mg and titrate if needed.  Will follow.    Lennette Bihari, MD, [facility] 11/15/2011 6:34 PM

## 2011-11-15 NOTE — ED Notes (Signed)
1st attempt at giving report. RN on floor unable to take due to fact that "she was just starting report" RN asked to give report to charge RN. Charge RN unable to take report.

## 2011-11-15 NOTE — ED Provider Notes (Signed)
Pt seen with resident BP 160/62  Pulse 52  Temp(Src) 97.8 F (36.6 C) (Oral)  Resp 20  SpO2 95% Appears to have afib, which is new for him Pt otherwise stable at this time  Joya Gaskins, MD 11/15/11 1738

## 2011-11-15 NOTE — ED Notes (Signed)
Cardiology at bedside.

## 2011-11-15 NOTE — ED Notes (Signed)
[id number] Ready 

## 2011-11-15 NOTE — ED Notes (Signed)
PT transported to [place] then to floor. Carollee Herter, RN to accompany pt on monitor.

## 2011-11-15 NOTE — ED Provider Notes (Signed)
History     CSN: [id number]  Arrival date & time 11/15/11  1600   First MD Initiated Contact with Patient 11/15/11 1614      Chief Complaint  Patient presents with  . Irregular Heart Beat    (Consider location/radiation/quality/duration/timing/severity/associated sxs/prior treatment) Patient is a 78 y.o. male presenting with abdominal pain.  Abdominal Pain The primary symptoms of the illness include abdominal pain. The primary symptoms of the illness do not include fever, shortness of breath, nausea, vomiting or diarrhea. Episode onset: 1 week ago. The onset of the illness was gradual. The problem has not changed since onset. The pain came on gradually. The abdominal pain has been unchanged (intermittent) since its onset. The abdominal pain is located in the LLQ. The abdominal pain radiates to the periumbilical region. Pain scale: moderate. The abdominal pain is relieved by nothing. Exacerbated by: nothing.  Associated with: sometimes associated with chest pain - "indigestion" The patient has not had a change in bowel habit. Additional symptoms associated with the illness include heartburn. Significant associated medical issues include diverticulitis.    Past Medical History  Diagnosis Date  . Renal disorder   . Abnormal involuntary movements   . Viral infection   . Hypogonadism male   . Fatigue   . Orthostatic lightheadedness   . Acute diarrhea   . Abdominal pain of unknown etiology   . Osteopenia   . Microalbuminuria   . Left bundle branch block   . Atrioventricular block, first degree   . Aortic insufficiency   . Abdominal aortic aneurysm   . History of benign prostatic hypertrophy   . Chronic renal disease, stage III   . Graves disease   . Hyperlipidemia   . Insomnia   . Health maintenance examination     Past Surgical History  Procedure Date  . Hernia repair   . Right foot     Family History  Problem Relation Age of Onset  . Stroke Mother   . Stroke Father    . Cancer Brother     History  Substance Use Topics  . Smoking status: Never Smoker   . Smokeless tobacco: Not on file  . Alcohol Use: Yes      1 drink a d      Review of Systems  Constitutional: Negative for fever.  HENT: Negative for congestion, facial swelling and trouble swallowing.   Respiratory: Negative for cough and shortness of breath.   Cardiovascular: Positive for chest pain.  Gastrointestinal: Positive for heartburn and abdominal pain. Negative for nausea, vomiting and diarrhea.  Genitourinary: Negative for difficulty urinating.  Skin: Negative for rash.  All other systems reviewed and are negative.    Allergies  Altace; Aspirin; and Lexapro  Home Medications   Current Outpatient Rx  Name Route Sig Dispense Refill  . ACETAMINOPHEN 325 MG PO TABS Oral Take 650 mg by mouth 2 (two) times daily.    . CHLORTHALIDONE 50 MG PO TABS Oral Take 50 mg by mouth daily.    Marland Kitchen VITAMIN D 1000 UNITS PO TABS Oral Take 2,000 Units by mouth daily.    Marland Kitchen EZETIMIBE-SIMVASTATIN 10-20 MG PO TABS Oral Take 0.5 tablets by mouth at bedtime.    . OMEGA-3 FATTY ACIDS 1000 MG PO CAPS Oral Take 1 g by mouth daily.    Marland Kitchen GLUCOSAMINE-CHONDROIT-BIOFL-MN PO CAPS Oral Take 1 capsule by mouth 2 (two) times daily.    Marland Kitchen POTASSIUM CHLORIDE ER 10 MEQ PO TBCR Oral Take 10 mEq by  mouth 2 (two) times daily.    . TESTOSTERONE CYPIONATE 100 MG/ML IM OIL Intramuscular Inject 200 mg into the muscle every 6 (six) weeks. For IM use only. Inject in left gluteal.    . TESTOSTERONE CYPIONATE 100 MG/ML IM OIL Intramuscular Inject 400 mg into the muscle every 6 (six) weeks. For IM use only. Inject into the right gluteal.    . ZALEPLON 5 MG PO CAPS Oral Take 5 mg by mouth at bedtime.      BP 175/73  Pulse 53  Temp(Src) 97.8 F (36.6 C) (Oral)  Resp 19  SpO2 96%  Physical Exam  Nursing note and vitals reviewed. Constitutional: He is oriented to person, place, and time. He appears well-developed and  well-nourished. No distress.       Elderly. NAD.  HENT:  Head: Normocephalic and atraumatic.  Mouth/Throat: Oropharynx is clear and moist.  Eyes: Conjunctivae are normal. No scleral icterus.       Right pupil is oblong.  Neck: Normal range of motion. Neck supple.  Cardiovascular: Normal rate, normal heart sounds and intact distal pulses.  An irregularly irregular rhythm present.  No murmur heard. Pulmonary/Chest: Effort normal and breath sounds normal. No stridor. No respiratory distress. He has no wheezes. He has no rales.  Abdominal: Soft. He exhibits no distension. There is no tenderness.  Musculoskeletal: Normal range of motion. He exhibits no edema.  Neurological: He is alert and oriented to person, place, and time.  Skin: Skin is warm and dry. No rash noted.  Psychiatric: He has a normal mood and affect. His behavior is normal.    ED Course  Procedures (including critical care time)  Labs Reviewed  CBC - Abnormal; Notable for the following:    WBC 18.3 (*)    RBC 3.70 (*)    MCV 106.8 (*)    MCH 35.1 (*)    Platelets 95 (*) PLATELET COUNT CONFIRMED BY SMEAR   All other components within normal limits  COMPREHENSIVE METABOLIC PANEL - Abnormal; Notable for the following:    BUN 37 (*)    Creatinine, Ser 1.61 (*)    Albumin 3.4 (*)    AST 39 (*)    GFR calc non Af Amer 36 (*)    GFR calc Af Amer 41 (*)    All other components within normal limits  URINALYSIS, ROUTINE W REFLEX MICROSCOPIC - Abnormal; Notable for the following:    Protein, ur 30 (*)    All other components within normal limits  DIFFERENTIAL - Abnormal; Notable for the following:    Neutrophils Relative 19 (*)    Lymphocytes Relative 78 (*)    Lymphs Abs 14.7 (*)    Monocytes Relative 2 (*)    All other components within normal limits  TROPONIN I  URINE MICROSCOPIC-ADD ON  TROPONIN I  TROPONIN I   No results found.   1. Atrial fibrillation     EKG: nonspecific ST and T waves changes, atrial  fibrillation, rate 61; LBBB, ST/T waves consistent with prior, A fib is new.     MDM  78 yo male with new onset A fib diagnosed at PCP's office today.  He has had a one week history of intermittent bloating left lower quadrant abdominal pain and chest pain (which he describes as indigestion), which is the reason for seeing his PCP today.  He currently has no abdominal pain or tenderness, and no chest pain.  He denies any current symptoms.  His heart rate  is in the 50's.  BP 175/73.  EKG shows A fib, which is new compared to EKG in April.  Will check troponins and labs.  No current abdominal pain, so do not think he currently needs abdominal imaging.    Labs showed leukocytosis and thrombocytopenia consistent with prior. Troponin negative.  Consulted Cardiology and PCP.  Admitted by PCP.  Remained stable during remainder of ED course.       Warnell Forester, MD 11/15/11 2114

## 2011-11-15 NOTE — H&P (Signed)
Brian Underwood is an 78 y.o. male.   Chief Complaint: Brian Underwood and jerking movements HPI: Brian Underwood is a pleasant and very functional 78 year old gentleman who has 4-5 days of symptoms.  He has generally not felt well.  He has had intermittent shaking and jerking spells.  He has had some vague lower abdominal discomfort.  He was seen in our office two days ago.  Labs were unrevealing.  He was seen today.  Vital signs were stable.  He was a bit clammy.  He was found to be in atrial fibrillation which is a new diagnosis for him. He was given a shot of rocephin in the office empirically to cover possible urinary source or diverticulosis as he had issues with these things in the past.  However, he has had no blood from above or below. He denies cp, sob or true fever.  He has had two nL bm per day this week (usual is one).  At the present time he has no discomfort.   He will be admitted for all of these symptoms and signs.  Past Medical History  Diagnosis Date  . Renal disorder   . Abnormal involuntary movements   . Viral infection   . Hypogonadism male   . Fatigue   . Orthostatic lightheadedness   . Acute diarrhea   . Abdominal pain of unknown etiology   . Osteopenia   . Microalbuminuria   . Left bundle branch block   . Atrioventricular block, first degree   . Aortic insufficiency   . Abdominal aortic aneurysm   . History of benign prostatic hypertrophy   . Chronic renal disease, stage III   . Graves disease   . Hyperlipidemia   . Insomnia   . Health maintenance examination   . CLL (chronic lymphoblastic leukemia)     Past Surgical History  Procedure Date  . Hernia repair   . Right foot     Family History  Problem Relation Age of Onset  . Stroke Mother   . Stroke Father   . Cancer Brother    Social History:  reports that he has never smoked. He does not have any smokeless tobacco history on file. He reports that he drinks alcohol. His drug history not on file.  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  Allergies:  Allergies  Allergen Reactions  . Altace   . Aspirin   . Lexapro     Medications Prior to Admission  Medication Dose Route Frequency Provider Last Rate Last Dose  . amLODipine (NORVASC) tablet 2.5 mg  2.5 mg Oral Daily Eda Paschal [place], [place]       No current outpatient prescriptions on file as of 11/15/2011.    Results for orders placed during the hospital encounter of 11/15/11 (from the past 48 hour(s))  CBC     Status: Abnormal   Collection Time   11/15/11  4:44 PM      Component Value Range Comment   WBC 18.3 (*) 4.0 - 10.5 (K/uL)    RBC 3.70 (*) 4.22 - 5.81 (MIL/uL)    Hemoglobin 13.0  13.0 - 17.0 (g/dL)    HCT 16.1  09.6 - 04.5 (%)    MCV 106.8 (*) 78.0 - 100.0 (fL)    MCH 35.1 (*) 26.0 - 34.0 (pg)    MCHC 32.9  30.0 - 36.0 (g/dL)    RDW 40.9  81.1 - 91.4 (%)    Platelets 95 (*) 150 -  400 (K/uL) PLATELET COUNT CONFIRMED BY SMEAR  COMPREHENSIVE METABOLIC PANEL     Status: Abnormal   Collection Time   11/15/11  4:44 PM      Component Value Range Comment   Sodium 142  135 - 145 (mEq/L)    Potassium 4.3  3.5 - 5.1 (mEq/L)    Chloride 106  96 - 112 (mEq/L)    CO2 26  19 - 32 (mEq/L)    Glucose, Bld 90  70 - 99 (mg/dL)    BUN 37 (*) 6 - 23 (mg/dL)    Creatinine, Ser 1.61 (*) 0.50 - 1.35 (mg/dL)    Calcium 8.8  8.4 - 10.5 (mg/dL)    Total Protein 6.1  6.0 - 8.3 (g/dL)    Albumin 3.4 (*) 3.5 - 5.2 (g/dL)    AST 39 (*) 0 - 37 (U/L)    ALT 32  0 - 53 (U/L)    Alkaline Phosphatase 74  39 - 117 (U/L)    Total Bilirubin 0.4  0.3 - 1.2 (mg/dL)    GFR calc non Af Amer 36 (*) >90 (mL/min)    GFR calc Af Amer 41 (*) >90 (mL/min)   TROPONIN I     Status: Normal   Collection Time   11/15/11  4:44 PM      Component Value Range Comment   Troponin I <0.30  <0.30 (ng/mL)   DIFFERENTIAL     Status: Abnormal   Collection Time   11/15/11  4:44 PM      Component Value Range Comment   Neutrophils Relative 19 (*) 43 - 77 (%)     Neutro Abs 3.7  1.7 - 7.7 (K/uL)    Lymphocytes Relative 78 (*) 12 - 46 (%)    Lymphs Abs 14.7 (*) 0.7 - 4.0 (K/uL)    Monocytes Relative 2 (*) 3 - 12 (%)    Monocytes Absolute 0.4  0.1 - 1.0 (K/uL)    Eosinophils Relative 1  0 - 5 (%)    Eosinophils Absolute 0.2  0.0 - 0.7 (K/uL)    Basophils Relative 0  0 - 1 (%)    Basophils Absolute 0.0  0.0 - 0.1 (K/uL)    WBC Morphology ATYPICAL LYMPHOCYTES   ABSOLUTE LYMPHOCYTOSIS  URINALYSIS, ROUTINE W REFLEX MICROSCOPIC     Status: Abnormal   Collection Time   11/15/11  5:11 PM      Component Value Range Comment   Color, Urine YELLOW  YELLOW     APPearance CLEAR  CLEAR     Specific Gravity, Urine 1.021  1.005 - 1.030     pH 6.0  5.0 - 8.0     Glucose, UA NEGATIVE  NEGATIVE (mg/dL)    Hgb urine dipstick NEGATIVE  NEGATIVE     Bilirubin Urine NEGATIVE  NEGATIVE     Ketones, ur NEGATIVE  NEGATIVE (mg/dL)    Protein, ur 30 (*) NEGATIVE (mg/dL)    Urobilinogen, UA 0.2  0.0 - 1.0 (mg/dL)    Nitrite NEGATIVE  NEGATIVE     Leukocytes, UA NEGATIVE  NEGATIVE    URINE MICROSCOPIC-ADD ON     Status: Normal   Collection Time   11/15/11  5:11 PM      Component Value Range Comment   Squamous Epithelial / LPF RARE  RARE     WBC, UA 0-2  <3 (WBC/hpf)    RBC / HPF 0-2  <3 (RBC/hpf)    No results found.  ROS:as per  the hpi.  Blood pressure 167/54, pulse 53, temperature 97.8 F (36.6 C), temperature source Oral, resp. rate 23, SpO2 94.00%.  semi-supine in bed. nad. alert and oriented times 4. moe x 4. grossly normal strength. Lungs CTA bilaterally.  No W/r/r.  Heart is irreg, irreg with 2/6 sem left and right sternal border. Abdomen is benign, soft, nt, nd, no mass or hsm.  Assessment/Plan Patient Active Problem List  Diagnoses  . AF (atrial fibrillation)-new diagnosis. Slow afib. Says he is a free bleeder on asa.  Appreciate cardiology input and orders.  . Abdominal pain-check abd u/s to be sure no major change in aaa.  Marland Kitchen LBBB (left bundle branch  block)-this is old.  .  bradycardia-tele  . AAA (abdominal aortic aneurysm), 5.1 X 4.7-check it today.  . Nephrolithiasis-follow  . CLL (chronic lymphoblastic leukemia)-wbc usually runs in this range.  . Diverticulosis-follow.  . AI (aortic insufficiency), 2D 2003-new echo ordered.  Marland Kitchen HTN (hypertension)-meds per cardiology for now. He is a full code status. I will not continue antibiotics unless further source is found.     Ezequiel Kayser, MD 11/15/2011, 7:20 PM

## 2011-11-15 NOTE — ED Notes (Signed)
Requested urine sample from patient.  Urinal by the bed side.

## 2011-11-15 NOTE — ED Notes (Addendum)
EDP aware of pts HR. Pt asymptomatic.

## 2011-11-15 NOTE — ED Notes (Signed)
Arrived by EMS from MD office for new onset afib.  Patient stated this had been going on for over a week but he seeked help today because he started feeling so bad.

## 2011-11-15 NOTE — ED Notes (Signed)
Admitting at bedside 

## 2011-11-16 LAB — COMPREHENSIVE METABOLIC PANEL
AST: 27 U/L (ref 0–37)
Albumin: 2.9 g/dL — ABNORMAL LOW (ref 3.5–5.2)
Calcium: 8.6 mg/dL (ref 8.4–10.5)
Chloride: 107 mEq/L (ref 96–112)
Creatinine, Ser: 1.72 mg/dL — ABNORMAL HIGH (ref 0.50–1.35)
Total Protein: 5.2 g/dL — ABNORMAL LOW (ref 6.0–8.3)

## 2011-11-16 LAB — DIFFERENTIAL
Basophils Relative: 0 % (ref 0–1)
Eosinophils Relative: 1 % (ref 0–5)
Lymphs Abs: 16.3 10*3/uL — ABNORMAL HIGH (ref 0.7–4.0)
Monocytes Absolute: 0.4 10*3/uL (ref 0.1–1.0)

## 2011-11-16 LAB — CBC
Hemoglobin: 11.8 g/dL — ABNORMAL LOW (ref 13.0–17.0)
MCH: 35.1 pg — ABNORMAL HIGH (ref 26.0–34.0)
MCV: 107.1 fL — ABNORMAL HIGH (ref 78.0–100.0)
RBC: 3.36 MIL/uL — ABNORMAL LOW (ref 4.22–5.81)

## 2011-11-16 LAB — TSH: TSH: 1.377 u[IU]/mL (ref 0.350–4.500)

## 2011-11-16 LAB — PATHOLOGIST SMEAR REVIEW

## 2011-11-16 MED ORDER — ATROPINE SULFATE 1 MG/ML IJ SOLN
INTRAMUSCULAR | Status: AC
  Filled 2011-11-16: qty 1

## 2011-11-16 NOTE — Progress Notes (Signed)
Subjective: He has been stable overnight. No sig. Abdomen discomfort.  Alert. He ate without difficulty.  Objective: Vital signs in last 24 hours: Temp:  [97.7 F (36.5 C)-98 F (36.7 C)] 98 F (36.7 C) (01/24 0800) Pulse Rate:  [40-71] 44  (01/24 0700) Resp:  [14-27] 21  (01/24 0700) BP: (112-177)/(40-157) 117/56 mmHg (01/24 0943) SpO2:  [92 %-97 %] 94 % (01/24 0700) Weight:  [81.9 kg (180 lb 8.9 oz)] 81.9 kg (180 lb 8.9 oz) (01/24 0400) Weight change:  Last BM Date: 11/16/11  Intake/Output from previous day: 01/23 0701 - 01/24 0700 In: 480 [P.O.:480] Out: 350 [Urine:350] Intake/Output this shift: Total I/O In: 123 [P.O.:120; I.V.:3] Out: -   General appearance: alert, cooperative and appears stated age Resp: clear to auscultation bilaterally Cardio: brady, irreg, irreg   2/6 sem left and rsb, no rub or gallop GI: soft, non-tender; bowel sounds normal; no masses,  no organomegaly Extremities: extremities normal, atraumatic, no cyanosis or edema Neurologic: Grossly normal   Lab Results:  Basename 11/16/11 0306 11/15/11 1644  WBC 20.4* 18.3*  HGB 11.8* 13.0  HCT 36.0* 39.5  PLT 98* 95*   BMET  Basename 11/16/11 0306 11/15/11 1644  NA 141 142  K 4.2 4.3  CL 107 106  CO2 27 26  GLUCOSE 104* 90  BUN 37* 37*  CREATININE 1.72* 1.61*  CALCIUM 8.6 8.8   CMET CMP     Component Value Date/Time   NA 141 11/16/2011 0306   K 4.2 11/16/2011 0306   CL 107 11/16/2011 0306   CO2 27 11/16/2011 0306   GLUCOSE 104* 11/16/2011 0306   BUN 37* 11/16/2011 0306   CREATININE 1.72* 11/16/2011 0306   CALCIUM 8.6 11/16/2011 0306   PROT 5.2* 11/16/2011 0306   ALBUMIN 2.9* 11/16/2011 0306   AST 27 11/16/2011 0306   ALT 26 11/16/2011 0306   ALKPHOS 58 11/16/2011 0306   BILITOT 0.5 11/16/2011 0306   GFRNONAA 33* 11/16/2011 0306   GFRAA 38* 11/16/2011 0306     Studies/Results: US Aorta  11/15/2011  *RADIOLOGY REPORT*  Clinical Data:  Evaluate known abdominal aortic aneurysm.  ULTRASOUND  OF ABDOMINAL AORTA  Technique:  Ultrasound examination of the abdominal aorta was performed to evaluate for abdominal aortic aneurysm.  Comparison: CT of the abdomen and pelvis performed 02/16/2011  Abdominal Aorta:  The patient's known abdominal aortic aneurysm is again seen; it has increased mildly in size from the prior study. On the prior study, it measured 5.2 cm in AP dimension and 5.2 cm in transverse dimension.        Maximum AP diameter:  5.5 cm.       Maximum TRV diameter:  5.5 cm.  There is mild luminal narrowing distally due to intramural thrombus and associated calcification.  The proximal abdominal aorta is not characterized; as noted on the prior CT, the fusiform aneurysm is somewhat tortuous with multiple dilated segments, though per the prior CT it remains normal in caliber at the level of the renal arteries.  Incidental note is made of two large left renal cysts, also noted on the prior CT.  IMPRESSION:  1.  Mild interval increase in size of abdominal aortic aneurysm, now measuring 5.5 cm in AP dimension and 5.5 cm in transverse dimension.  Mild luminal narrowing noted distally due to intramural thrombus and associated calcification.  The fusiform aneurysm is somewhat tortuous in appearance, as on prior CT. 2.  Two large left renal cysts again seen.  Original  Report Authenticated By: Tonia Ghent, M.D.    Medications: I have reviewed the patient's current medications.     Marland Kitchen amLODipine  2.5 mg Oral Daily  . amLODipine  2.5 mg Oral To Major  . atropine      . chlorthalidone  25 mg Oral Daily  . cholecalciferol  2,000 Units Oral Daily  . ezetimibe-simvastatin  0.5 tablet Oral QHS  . potassium chloride  10 mEq Oral BID  . sodium chloride  3 mL Intravenous Q12H     Assessment/Plan:  Principal Problem:  *Abdominal pain-improving. His AAA is now 5.5 cm which is a bit of an increase.  I do not currently think that this is what has caused his symptoms. Short outpt. F/up with Dr. Arbie Cookey  is recommended. Active Problems:  AF (atrial fibrillation)-I feel slow afib is the cause of most of his symptoms now.  He does stay in the mid 40's and sometimes goes lower.  Difficult decisions about anticoagulation and other treatments and I appreciate cardiology care in this case.  LBBB (left bundle branch block)-not new  Sinus bradycardia-see above.  AAA (abdominal aortic aneurysm), 5.1 X 4.7-see above  Nephrolithiasis-quiet  CLL (chronic lymphoblastic leukemia)-wbc is close to level it has been for last several years (avg 16 to 18 or so)  Diverticulosis-I don't think this is active at the moment.  AI (aortic insufficiency), 2D 2003-echo pending  HTN (hypertension)-per cardiology. In past he was on beta blocker, avapro and thiazide. He was having orthostasis and low bp so lately he has just been on the thiazide. Follow.   LOS: 1 day   Ezequiel Kayser, MD 11/16/2011, 11:15 AM

## 2011-11-16 NOTE — Progress Notes (Signed)
Patient's HR intermittently going into 30's, lowest seen 32, while in Afib. Patient asymptomatic and other VSS. Dr. Bard Herbert with [facility] notified. MD stated to place atropine at bedside and place patient on Zoll pads and leave at bedside if patient becomes symptomatic. Will continue to monitor. Updated patient on plan of care.  Windell Moment, RN

## 2011-11-16 NOTE — ED Provider Notes (Signed)
I have personally seen and examined the patient.  I have discussed the plan of care with the resident.  I have reviewed the documentation on PMH/FH/Soc. History.  I have reviewed the documentation of the resident and agree.  I have reviewed and agree with the ECG interpretation(s) documented by the resident.  I spoke to both cardiology (dr Tresa Endo) and dr Waynard Edwards, dr perini admitted patient  Joya Gaskins, MD 11/16/11 1910

## 2011-11-16 NOTE — Progress Notes (Signed)
11-16-11 UR completed. Kamalei Roeder RN BSN  

## 2011-11-16 NOTE — Progress Notes (Signed)
[facility]  Subjective: Improvement in abdominal pain intensity and frequency of episodes  Objective: Vital signs in last 24 hours: Temp:  [97.7 F (36.5 C)-98 F (36.7 C)] 98 F (36.7 C) (01/24 1200) Pulse Rate:  [40-71] 44  (01/24 0700) Resp:  [14-27] 21  (01/24 0700) BP: (112-177)/(40-157) 117/56 mmHg (01/24 0943) SpO2:  [92 %-97 %] 94 % (01/24 0700) Weight:  [81.9 kg (180 lb 8.9 oz)] 81.9 kg (180 lb 8.9 oz) (01/24 0400) Last BM Date: 11/16/11  Intake/Output from previous day: 01/23 0701 - 01/24 0700 In: 480 [P.O.:480] Out: 350 [Urine:350] Intake/Output this shift: Total I/O In: 363 [P.O.:360; I.V.:3] Out: 150 [Urine:150]  Medications Current Facility-Administered Medications  Medication Dose Route Frequency Provider Last Rate Last Dose  . acetaminophen (TYLENOL) tablet 650 mg  650 mg Oral Q6H PRN Ezequiel Kayser, MD   650 mg at 11/16/11 0744  . amLODipine (NORVASC) tablet 2.5 mg  2.5 mg Oral Daily Eda Paschal [place], [place]      . amLODipine (NORVASC) tablet 2.5 mg  2.5 mg Oral To Major Joya Gaskins, MD   2.5 mg at 11/15/11 2039  . atropine 1 MG/ML injection           . chlorthalidone (HYGROTON) tablet 25 mg  25 mg Oral Daily Ezequiel Kayser, MD      . cholecalciferol (VITAMIN D) tablet 2,000 Units  2,000 Units Oral Daily Ezequiel Kayser, MD   2,000 Units at 11/16/11 [phone]  . ezetimibe-simvastatin (VYTORIN) 10-20 MG per tablet 0.5 tablet  0.5 tablet Oral QHS Ezequiel Kayser, MD      . potassium chloride (K-DUR) CR tablet 10 mEq  10 mEq Oral BID Ezequiel Kayser, MD   10 mEq at 11/16/11 0943  . sodium chloride 0.9 % injection 3 mL  3 mL Intravenous Q12H Ezequiel Kayser, MD   3 mL at 11/16/11 0942  . zolpidem (AMBIEN) tablet 5 mg  5 mg Oral QHS PRN Ezequiel Kayser, MD        PE: General appearance: alert, cooperative and no distress.  One episode of abdominal pain lasting a couple  seconds. Lungs: clear to auscultation bilaterally Heart: irregularly  irregular rhythm,  Rate slow Abdomen: +BS nontender Extremities: No LEE Pulses: 2+ and symmetric  Lab Results:   Basename 11/16/11 0306 11/15/11 1644  WBC 20.4* 18.3*  HGB 11.8* 13.0  HCT 36.0* 39.5  PLT 98* 95*   BMET  Basename 11/16/11 0306 11/15/11 1644  NA 141 142  K 4.2 4.3  CL 107 106  CO2 27 26  GLUCOSE 104* 90  BUN 37* 37*  CREATININE 1.72* 1.61*  CALCIUM 8.6 8.8    Studies/Results: ULTRASOUND OF ABDOMINAL AORTA, 11/15/11 Technique: Ultrasound examination of the abdominal aorta was  performed to evaluate for abdominal aortic aneurysm.  Comparison: CT of the abdomen and pelvis performed 02/16/2011  Abdominal Aorta: The patient's known abdominal aortic aneurysm is  again seen; it has increased mildly in size from the prior study.  On the prior study, it measured 5.2 cm in AP dimension and 5.2 cm  in transverse dimension.  Maximum AP diameter: 5.5 cm.  Maximum TRV diameter: 5.5 cm.  There is mild luminal narrowing distally due to intramural thrombus  and associated calcification. The proximal abdominal aorta is not  characterized; as noted on the prior CT, the fusiform aneurysm is  somewhat tortuous with multiple dilated segments, though per the  prior  CT it remains normal in caliber at the level of the renal  arteries.  Incidental note is made of two large left renal cysts, also noted  on the prior CT.  IMPRESSION:  1. Mild interval increase in size of abdominal aortic aneurysm,  now measuring 5.5 cm in AP dimension and 5.5 cm in transverse  dimension. Mild luminal narrowing noted distally due to intramural  thrombus and associated calcification. The fusiform aneurysm is  somewhat tortuous in appearance, as on prior CT.  2. Two large left renal cysts again seen.  Assessment/Plan  Principal Problem:  *Abdominal pain Active Problems:  AF (atrial fibrillation)  LBBB (left bundle branch block)  Sinus bradycardia  AAA (abdominal aortic aneurysm), 5.1 X  4.7  Nephrolithiasis  CLL (chronic lymphoblastic leukemia)  Diverticulosis  AI (aortic insufficiency), 2D 2003  HTN (hypertension)  Plan:  Afib SVR/RVR, Currently- HR 40-50s, No CP or SOB.  2D Echo pending.  Amlodipine 2.5mg   initiated yesterday.  ? Anticoagulation in light of age and AAA. Pacemaker?     LOS: 1 day    HAGER,BRYAN W 11/16/2011 1:40 PM   Agree with note written by [facility]  Pt admitted with abd pain, LBBB (old), tachy/brady syndrome on no negative chronotropes. 2D echo pending. Plts 98K. H/O AAA. Will arrange for PTVPM tomorrow with Dr. Royann Shivers. I have discussed with Dr. Darcus Austin as well.  Runell Gess 11/16/2011 2:30 PM

## 2011-11-16 NOTE — Progress Notes (Signed)
  Echocardiogram 2D Echocardiogram has been performed.  Brian Underwood Brian Underwood 11/16/2011, 1:46 PM

## 2011-11-16 NOTE — Progress Notes (Signed)
Patient had 55+ beat run of Vtach while EKG tech in room performing EKG. Patient asymptomatic, denies CP or SOB. other VSS. Dr. Tresa Endo notified of EKG results of rapid Afib with RVR and then went into Vtach. No new orders received. MD stated that he will be up to see patient shortly. Will continue to monitor.   Windell Moment, RN

## 2011-11-17 ENCOUNTER — Inpatient Hospital Stay (HOSPITAL_COMMUNITY): Payer: Medicare PPO

## 2011-11-17 ENCOUNTER — Encounter (HOSPITAL_COMMUNITY)
Admission: EM | Disposition: A | Discharge status: Home / Self Care | Payer: Self-pay | Source: Home / Self Care | Attending: Internal Medicine

## 2011-11-17 DIAGNOSIS — Z950 Presence of cardiac pacemaker: Secondary | ICD-10-CM

## 2011-11-17 HISTORY — PX: PACEMAKER INSERTION: SHX728

## 2011-11-17 HISTORY — PX: PERMANENT PACEMAKER INSERTION: SHX5480

## 2011-11-17 HISTORY — DX: Presence of cardiac pacemaker: Z95.0

## 2011-11-17 LAB — DIFFERENTIAL
Basophils Relative: 0 % (ref 0–1)
Eosinophils Relative: 1 % (ref 0–5)
Lymphs Abs: 18.7 10*3/uL — ABNORMAL HIGH (ref 0.7–4.0)
Monocytes Absolute: 0.5 10*3/uL (ref 0.1–1.0)
Monocytes Relative: 2 % — ABNORMAL LOW (ref 3–12)
Neutro Abs: 3.4 10*3/uL (ref 1.7–7.7)

## 2011-11-17 LAB — COMPREHENSIVE METABOLIC PANEL
AST: 21 U/L (ref 0–37)
Albumin: 3 g/dL — ABNORMAL LOW (ref 3.5–5.2)
Chloride: 106 mEq/L (ref 96–112)
Creatinine, Ser: 1.71 mg/dL — ABNORMAL HIGH (ref 0.50–1.35)
Potassium: 4.4 mEq/L (ref 3.5–5.1)
Total Bilirubin: 0.5 mg/dL (ref 0.3–1.2)
Total Protein: 5.4 g/dL — ABNORMAL LOW (ref 6.0–8.3)

## 2011-11-17 LAB — CBC
HCT: 36.1 % — ABNORMAL LOW (ref 39.0–52.0)
MCH: 34.8 pg — ABNORMAL HIGH (ref 26.0–34.0)
MCV: 105.6 fL — ABNORMAL HIGH (ref 78.0–100.0)
Platelets: 94 10*3/uL — ABNORMAL LOW (ref 150–400)
RBC: 3.42 MIL/uL — ABNORMAL LOW (ref 4.22–5.81)

## 2011-11-17 SURGERY — PERMANENT PACEMAKER INSERTION
Anesthesia: LOCAL

## 2011-11-17 MED ORDER — ONDANSETRON HCL 4 MG/2ML IJ SOLN: 4.0000 mg | Freq: Four times a day (QID) | INTRAMUSCULAR | Status: DC | PRN

## 2011-11-17 MED ORDER — METOPROLOL TARTRATE 25 MG PO TABS
25.0000 mg | ORAL_TABLET | Freq: Two times a day (BID) | ORAL | Status: DC
  Administered 2011-11-18 – 2011-11-19 (×3): 25 mg via ORAL
  Filled 2011-11-17 (×6): qty 1

## 2011-11-17 MED ORDER — SODIUM CHLORIDE 0.9 % IV SOLN
INTRAVENOUS | Status: DC
  Administered 2011-11-17 – 2011-11-18 (×2): via INTRAVENOUS

## 2011-11-17 MED ORDER — ACETAMINOPHEN 325 MG PO TABS
325.0000 mg | ORAL_TABLET | ORAL | Status: DC | PRN
  Administered 2011-11-17 – 2011-11-18 (×2): 650 mg via ORAL
  Filled 2011-11-17 (×3): qty 2

## 2011-11-17 MED ORDER — CEFAZOLIN SODIUM 1-5 GM-% IV SOLN
1.0000 g | INTRAVENOUS | Status: DC
  Filled 2011-11-17 (×2): qty 50

## 2011-11-17 MED ORDER — HYDROCODONE-ACETAMINOPHEN 5-325 MG PO TABS: 1.0000 | ORAL_TABLET | ORAL | Status: DC | PRN

## 2011-11-17 MED ORDER — CEFAZOLIN SODIUM 1-5 GM-% IV SOLN
1.0000 g | Freq: Four times a day (QID) | INTRAVENOUS | Status: AC
  Administered 2011-11-18 (×2): 1 g via INTRAVENOUS
  Filled 2011-11-17 (×3): qty 50

## 2011-11-17 MED ORDER — HEPARIN (PORCINE) IN NACL 2-0.9 UNIT/ML-% IJ SOLN
INTRAMUSCULAR | Status: AC
  Filled 2011-11-17: qty 1000

## 2011-11-17 MED ORDER — SODIUM CHLORIDE 0.9 % IR SOLN
Status: DC
  Filled 2011-11-17 (×2): qty 2

## 2011-11-17 MED ORDER — LIDOCAINE HCL (PF) 1 % IJ SOLN
INTRAMUSCULAR | Status: AC
  Filled 2011-11-17: qty 60

## 2011-11-17 NOTE — Op Note (Signed)
Brian Underwood, Brian Underwood  Male, 78 y.o., 02/27/1918  MRN: [id number]  Procedure report  Procedure performed:  1. Implantation of new dual chamber permanent pacemaker 2. Fluoroscopy 3.  Light sedation  Reason for procedure: Symptomatic bradycardia due to: - Sinus node dysfunction - Atrial fibrillation with slow ventricular response - Bradycardia due to necessary medications  Procedure performed by: Thurmon Fair, MD  Complications: None  Estimated blood loss: <10 mL  Medications administered during procedure: Ancef 1 g intravenously Lidocaine 1% 30 mL locally,   Device details: Facilities [employer] accent DR RF model PM 2210 serial number [id number] Right atrial lead [employer] 2088 TC-52 serial number [id number] Right ventricular lead [employer]-2088TC-58 serial number [id number]  Procedure details:  After the risks and benefits of the procedure were discussed the patient provided informed consent and was brought to the cardiac cath lab in the fasting state. The patient was prepped and draped in usual sterile fashion. Local anesthesia with 1% lidocaine was administered to to the left infraclavicular area. A 5-6 cm horizontal incision was made parallel with and 2-3 cm caudal to the left clavicle. Using electrocautery and blunt dissection a prepectoral pocket was created down to the level of the pectoralis major muscle fascia. The pocket was carefully inspected for hemostasis. An antibiotic-soaked sponge was placed in the pocket.  Under fluoroscopic guidance and using the modified Seldinger technique 2 separate venipunctures were performed to access the left subclavian vein. no difficulty was encountered accessing the vein.  Two J-tip guidewires were subsequently exchanged for 2 7French safe sheaths.  Under fluoroscopic guidance the ventricular lead was advanced to level of the mid to apical right ventricular septum and thet active-fixation helix was deployed. Prominent  current of injury was seen. Satisfactory pacing and sensing parameters were recorded. There was no evidence of diaphragmatic stimulation at maximum device output. The safe sheath was peeled away and the lead was secured in place with 2-0 silk.  In similar fashion the right atrial lead was advanced to the level of the atrial appendage. The active-fixation helix was deployed. There was prominent current of injury. Satisfactory  pacing and sensing parameters were recorded. There was no evidence of diaphragmatic stimulation with pacing at maximum device output. The safe sheath was peeled away and the lead was secured in place with 2-0 silk.  The antibiotic-soaked sponge was removed from the pocket. The pocket was flushed with copious amounts of antibiotic solution. Reinspection showed excellent hemostasis..  The ventricular lead was connected to the generator and appropriate ventricular pacing was seen. Subsequently the atrial lead was also connected. Repeat testing of the lead parameters later showed excellent values.  The entire system was then carefully inserted in the pocket with care been taking that the leads and device assumed a comfortable position without pressure on the incision. Great care was taken that the leads be located deep to the generator. The pocket was then closed in layers using 2 layers of 2-0 Vicryl and cutaneous staples, after which a sterile dressing was applied.  When the procedure was initiated the patient's rhythm was atrial fibrillation. Towards the end of the procedure after the device had been placed in the pocket the rhythm spontaneously converted to normal sinus rhythm with a rate of approximately 60 beats per minute. Native conduction was noted with a long PR interval of approximately 300-320 ms  At the end of the procedure the following lead parameters were encountered:  Right atrial lead  sensed P waves 2.4 mV,  impedance 464 ohms, threshold 1.4 V at 0.5 ms pulse  width.  Right ventricular lead  sensed R waves 20.7 mV, impedance 887 ohms, threshold 0.6 V at 0.5 ms pulse width.   ZO:XWRUEAV [name] M.D., [name] M.D., [name] M.D.

## 2011-11-17 NOTE — Progress Notes (Signed)
Subjective: No new complaints. Feels washed out.  Objective: Vital signs in last 24 hours: Temp:  [97.5 F (36.4 C)-98.9 F (37.2 C)] 98.2 F (36.8 C) (01/25 0830) Pulse Rate:  [44-67] 55  (01/25 0830) Resp:  [16-25] 21  (01/25 0830) BP: (117-167)/(42-95) 167/66 mmHg (01/25 0830) SpO2:  [92 %-96 %] 96 % (01/25 0830) Weight:  [82.6 kg (182 lb 1.6 oz)] 82.6 kg (182 lb 1.6 oz) (01/25 0415) Weight change: 0.7 kg (1 lb 8.7 oz) Last BM Date: 11/17/11  Intake/Output from previous day: 01/24 0701 - 01/25 0700 In: 603 [P.O.:600; I.V.:3] Out: 600 [Urine:600] Intake/Output this shift:    General appearance: alert and cooperative Resp: clear to auscultation bilaterally Cardio: irreg, irreg,brady, paradoxical split s2, 1/ GI: soft, non-tender; bowel sounds normal; no masses,  no organomegaly Extremities: extremities normal, atraumatic, no cyanosis or edema Neurologic: Grossly normal   Lab Results:  Basename 11/17/11 0355 11/16/11 0306  WBC 22.8* 20.4*  HGB 11.9* 11.8*  HCT 36.1* 36.0*  PLT 94* 98*   BMET  Basename 11/17/11 0355 11/16/11 0306  NA 141 141  K 4.4 4.2  CL 106 107  CO2 25 27  GLUCOSE 94 104*  BUN 39* 37*  CREATININE 1.71* 1.72*  CALCIUM 8.7 8.6   CMET CMP     Component Value Date/Time   NA 141 11/17/2011 0355   K 4.4 11/17/2011 0355   CL 106 11/17/2011 0355   CO2 25 11/17/2011 0355   GLUCOSE 94 11/17/2011 0355   BUN 39* 11/17/2011 0355   CREATININE 1.71* 11/17/2011 0355   CALCIUM 8.7 11/17/2011 0355   PROT 5.4* 11/17/2011 0355   ALBUMIN 3.0* 11/17/2011 0355   AST 21 11/17/2011 0355   ALT 21 11/17/2011 0355   ALKPHOS 56 11/17/2011 0355   BILITOT 0.5 11/17/2011 0355   GFRNONAA 33* 11/17/2011 0355   GFRAA 39* 11/17/2011 0355     Studies/Results: Dg Chest 2 View  11/17/2011  *RADIOLOGY REPORT*  Clinical Data: Weakness.  Elevated white blood cells.  History of congestive heart failure and atrial fibrillation.  CHEST - 2 VIEW  Comparison: Multiple priors, most  recently 12/09/2009.  Findings: Compared to prior examination, there is increasing diffuse interstitial prominence.  However, there does not appear to be significant cephalization of the pulmonary vasculature.  No consolidative airspace disease.  Minimal blunting of the costophrenic sulci bilaterally consistent with small bilateral pleural effusions.  Mild diffuse bronchial thickening.  Mild cardiomegaly.  Atherosclerosis in the aorta. The patient is rotated to the left on today's exam, resulting in distortion of the mediastinal contours and reduced diagnostic sensitivity and specificity for mediastinal pathology.  IMPRESSION:  1.  Small bilateral pleural effusions. 2.  Diffuse interstitial prominence, markedly increased compared to prior examination.  Given the lack of cephalization, this is felt unlikely to represent pulmonary edema. There is also mild bronchial wall thickening.  This may alternatively reflect an early atypical infectious process such as a viral bronchitis. 3.  Atherosclerosis.  Original Report Authenticated By: Florencia Reasons, M.D.   US Aorta  11/15/2011  *RADIOLOGY REPORT*  Clinical Data:  Evaluate known abdominal aortic aneurysm.  ULTRASOUND OF ABDOMINAL AORTA  Technique:  Ultrasound examination of the abdominal aorta was performed to evaluate for abdominal aortic aneurysm.  Comparison: CT of the abdomen and pelvis performed 02/16/2011  Abdominal Aorta:  The patient's known abdominal aortic aneurysm is again seen; it has increased mildly in size from the prior study. On the prior study, it measured  5.2 cm in AP dimension and 5.2 cm in transverse dimension.        Maximum AP diameter:  5.5 cm.       Maximum TRV diameter:  5.5 cm.  There is mild luminal narrowing distally due to intramural thrombus and associated calcification.  The proximal abdominal aorta is not characterized; as noted on the prior CT, the fusiform aneurysm is somewhat tortuous with multiple dilated segments, though per  the prior CT it remains normal in caliber at the level of the renal arteries.  Incidental note is made of two large left renal cysts, also noted on the prior CT.  IMPRESSION:  1.  Mild interval increase in size of abdominal aortic aneurysm, now measuring 5.5 cm in AP dimension and 5.5 cm in transverse dimension.  Mild luminal narrowing noted distally due to intramural thrombus and associated calcification.  The fusiform aneurysm is somewhat tortuous in appearance, as on prior CT. 2.  Two large left renal cysts again seen.  Original Report Authenticated By: Tonia Ghent, M.D.   ECHO: - Left ventricle: The cavity size was mildly dilated. There was moderate concentric hypertrophy. Systolic function was mildly reduced. The estimated ejection fraction was in the range of 45% to 50%. Although no diagnostic regional wall motion abnormality was identified, this possibility cannot be completely excluded on the basis of this study. There was a reduced contribution of atrial contraction to ventricular filling, due to increased ventricular diastolic pressure or atrial contractile dysfunction. Doppler parameters are consistent with a reversible restrictive pattern, indicative of decreased left ventricular diastolic compliance and/or increased left atrial pressure (grade 3 diastolic dysfunction). - Aortic valve: Moderately calcified annulus. Trileaflet. Mild to moderate regurgitation. - Mitral valve: Calcified annulus. Mildly thickened leaflets . Mild regurgitation. - Left atrium: The atrium was mildly to moderately dilated. - Right ventricle: Systolic pressure was increased.  ECG: AF with slow VR and LBBB; yesterday at 0645h an episode of wide complex tachycardia with RBBB+LAFB morphology @133  bpm is still irregular, but the abrupt onset and change in QRS suggest it is a relatively slow monomorphic VT.  Medications: I have reviewed the patient's current medications.  Assessment/Plan:  Principal  Problem:  *Abdominal pain-just a couple touches of this yesterday. Active Problems:  AF (atrial fibrillation) with tachy brady. For pacer.  LBBB (left bundle branch block)-known previously.  Sustained slow VT, hemodynamically stable  Sinus bradycardia-had this prior to these issues  AAA (abdominal aortic aneurysm), 5.1 X 4.7-dow 5.5 cm. outpt f/up.  Nephrolithiasis=quiet  CLL (chronic lymphoblastic leukemia)-not clear why wbc has trended up a bit. No clear source of any infection now. Baseline wbc around 16-18 the last few years.  Diverticulosis-follow  AI (aortic insufficiency), Mild to moderate  HTN (hypertension)  CHF with ef 45-50% and diastolic disfunction.  Some pulm htn noted. ckd cr stable for him overall.   He meets criteria for implantation of a dual chamber permanent pacemaker for symptomatic bradycardia in setting of sinus node and conduction system dysfunction. Although now in persistent AF, ECG as recent as April showed SBrady. Anticoagulation is an issue as he has a very large AAA and recent abdominal discomfort. This would probably preclude cardioversion if we cannot safely anticoagulate. Will review DC-CV options at a future date. Needs evaluation for CAD in view of monomorphic VT and mildly depressed EF, especially if AAA repair is contemplated at some point.  Risks and benefits of dual chamber PPM reviewed in detail. He agrees to proceed.  LOS: 2 days  Thurmon Fair, MD 11/17/2011, 9:05 AM

## 2011-11-17 NOTE — Progress Notes (Addendum)
Subjective: No new complaints. Feels washed out.  Objective: Vital signs in last 24 hours: Temp:  [97.5 F (36.4 C)-98.9 F (37.2 C)] 97.9 F (36.6 C) (01/25 0415) Pulse Rate:  [44-67] 52  (01/25 0415) Resp:  [16-25] 21  (01/25 0415) BP: (117-154)/(42-95) 139/63 mmHg (01/25 0415) SpO2:  [92 %-96 %] 94 % (01/25 0415) Weight:  [82.6 kg (182 lb 1.6 oz)] 82.6 kg (182 lb 1.6 oz) (01/25 0415) Weight change: 0.7 kg (1 lb 8.7 oz) Last BM Date: 11/16/11  Intake/Output from previous day: 01/24 0701 - 01/25 0700 In: 603 [P.O.:600; I.V.:3] Out: 600 [Urine:600] Intake/Output this shift:    General appearance: alert and cooperative Resp: clear to auscultation bilaterally Cardio: irreg, irreg GI: soft, non-tender; bowel sounds normal; no masses,  no organomegaly Extremities: extremities normal, atraumatic, no cyanosis or edema Neurologic: Grossly normal   Lab Results:  Basename 11/17/11 0355 11/16/11 0306  WBC 22.8* 20.4*  HGB 11.9* 11.8*  HCT 36.1* 36.0*  PLT 94* 98*   BMET  Basename 11/17/11 0355 11/16/11 0306  NA 141 141  K 4.4 4.2  CL 106 107  CO2 25 27  GLUCOSE 94 104*  BUN 39* 37*  CREATININE 1.71* 1.72*  CALCIUM 8.7 8.6   CMET CMP     Component Value Date/Time   NA 141 11/17/2011 0355   K 4.4 11/17/2011 0355   CL 106 11/17/2011 0355   CO2 25 11/17/2011 0355   GLUCOSE 94 11/17/2011 0355   BUN 39* 11/17/2011 0355   CREATININE 1.71* 11/17/2011 0355   CALCIUM 8.7 11/17/2011 0355   PROT 5.4* 11/17/2011 0355   ALBUMIN 3.0* 11/17/2011 0355   AST 21 11/17/2011 0355   ALT 21 11/17/2011 0355   ALKPHOS 56 11/17/2011 0355   BILITOT 0.5 11/17/2011 0355   GFRNONAA 33* 11/17/2011 0355   GFRAA 39* 11/17/2011 0355     Studies/Results: US Aorta  11/15/2011  *RADIOLOGY REPORT*  Clinical Data:  Evaluate known abdominal aortic aneurysm.  ULTRASOUND OF ABDOMINAL AORTA  Technique:  Ultrasound examination of the abdominal aorta was performed to evaluate for abdominal aortic aneurysm.   Comparison: CT of the abdomen and pelvis performed 02/16/2011  Abdominal Aorta:  The patient's known abdominal aortic aneurysm is again seen; it has increased mildly in size from the prior study. On the prior study, it measured 5.2 cm in AP dimension and 5.2 cm in transverse dimension.        Maximum AP diameter:  5.5 cm.       Maximum TRV diameter:  5.5 cm.  There is mild luminal narrowing distally due to intramural thrombus and associated calcification.  The proximal abdominal aorta is not characterized; as noted on the prior CT, the fusiform aneurysm is somewhat tortuous with multiple dilated segments, though per the prior CT it remains normal in caliber at the level of the renal arteries.  Incidental note is made of two large left renal cysts, also noted on the prior CT.  IMPRESSION:  1.  Mild interval increase in size of abdominal aortic aneurysm, now measuring 5.5 cm in AP dimension and 5.5 cm in transverse dimension.  Mild luminal narrowing noted distally due to intramural thrombus and associated calcification.  The fusiform aneurysm is somewhat tortuous in appearance, as on prior CT. 2.  Two large left renal cysts again seen.  Original Report Authenticated By: Tonia Ghent, M.D.    Medications: I have reviewed the patient's current medications.  Assessment/Plan:  Principal Problem:  *Abdominal pain-just  a couple touches of this yesterday. Active Problems:  AF (atrial fibrillation) with tachy brady. For pacer.  LBBB (left bundle branch block)-known previously.  Sinus bradycardia-had this prior to these issues  AAA (abdominal aortic aneurysm), 5.1 X 4.7-dow 5.5 cm. outpt f/up.  Nephrolithiasis=quiet  CLL (chronic lymphoblastic leukemia)-not clear why wbc has trended up a bit. No clear source of any infection now. Baseline wbc around 16-18 the last few years.  Diverticulosis-follow  AI (aortic insufficiency), 2D 2003-echo done  HTN (hypertension)=follow. CHF with ef 45-50% and diastolic  disfunction.  Some pulm htn noted.  Mild mr. Other valves okay. No sig . AI reported. ckd cr stable for him overall.   LOS: 2 days   Ezequiel Kayser, MD 11/17/2011, 7:33 AM

## 2011-11-18 ENCOUNTER — Inpatient Hospital Stay (HOSPITAL_COMMUNITY): Payer: Medicare PPO

## 2011-11-18 LAB — COMPREHENSIVE METABOLIC PANEL
ALT: 15 U/L (ref 0–53)
Alkaline Phosphatase: 57 U/L (ref 39–117)
BUN: 34 mg/dL — ABNORMAL HIGH (ref 6–23)
CO2: 23 mEq/L (ref 19–32)
Chloride: 109 mEq/L (ref 96–112)
GFR calc Af Amer: 49 mL/min — ABNORMAL LOW (ref >90)
GFR calc non Af Amer: 42 mL/min — ABNORMAL LOW (ref >90)
Glucose, Bld: 85 mg/dL (ref 70–99)
Potassium: 4.3 mEq/L (ref 3.5–5.1)
Sodium: 142 mEq/L (ref 135–145)
Total Bilirubin: 0.5 mg/dL (ref 0.3–1.2)

## 2011-11-18 LAB — CBC
HCT: 36.4 % — ABNORMAL LOW (ref 39.0–52.0)
MCHC: 32.7 g/dL (ref 30.0–36.0)
Platelets: 101 10*3/uL — ABNORMAL LOW (ref 150–400)
RDW: 13.8 % (ref 11.5–15.5)
WBC: 22.5 10*3/uL — ABNORMAL HIGH (ref 4.0–10.5)

## 2011-11-18 NOTE — Progress Notes (Signed)
Subjective: Patient is seen today doing quite well up in a chair. Dr. Gery Pray is at the bedside talking with him following the pacemaker insertion. He is anxious to go home but Dr. Allyson Sabal wishes to watch him one more day. He certainly having no GI symptoms. He's having no nausea vomiting. He's having no chest pain or shortness of breath.    Objective: Vital signs in last 24 hours: Temp:  [97.9 F (36.6 C)-98.2 F (36.8 C)] 98.1 F (36.7 C) (01/26 0800) Pulse Rate:  [47-74] 74  (01/26 0800) Resp:  [20-26] 24  (01/26 0800) BP: (134-148)/(55-67) 134/67 mmHg (01/26 0800) SpO2:  [90 %-93 %] 93 % (01/26 0800) Weight:  [83.9 kg (184 lb 15.5 oz)-87.6 kg (193 lb 2 oz)] 87.6 kg (193 lb 2 oz) (01/26 0500) Weight change: 1.3 kg (2 lb 13.9 oz)  CBG (last 3)  No results found for this basename: GLUCAP:3 in the last 72 hours  Intake/Output from previous day: 01/25 0701 - 01/26 0700 In: 990 [P.O.:940; I.V.:50] Out: 400 [Urine:400]  Physical Exam: No detailed exam was done he appears well sitting at the bedside. Cardiology is involved with him at this time.   Lab Results:  Basename 11/18/11 0500 11/17/11 0355 11/15/11 2149  NA 142 141 --  K 4.3 4.4 --  CL 109 106 --  CO2 23 25 --  GLUCOSE 85 94 --  BUN 34* 39* --  CREATININE 1.40* 1.71* --  CALCIUM 8.3* 8.7 --  MG -- -- 1.9  PHOS -- -- 3.0    Basename 11/18/11 0500 11/17/11 0355  AST 21 21  ALT 15 21  ALKPHOS 57 56  BILITOT 0.5 0.5  PROT 5.3* 5.4*  ALBUMIN 2.8* 3.0*    Basename 11/18/11 0500 11/17/11 0355 11/16/11 0306  WBC 22.5* 22.8* --  NEUTROABS -- 3.4 3.5  HGB 11.9* 11.9* --  HCT 36.4* 36.1* --  MCV 104.9* 105.6* --  PLT 101* 94* --   Lab Results  Component Value Date   INR 1.15 11/17/2011    Basename 11/16/11 0305 11/15/11 1850 11/15/11 1644  CKTOTAL -- -- --  CKMB -- -- --  CKMBINDEX -- -- --  TROPONINI <0.30 <0.30 <0.30    Basename 11/15/11 2149  TSH 1.377  T4TOTAL --  T3FREE --  THYROIDAB --   No  results found for this basename: VITAMINB12:2,FOLATE:2,FERRITIN:2,TIBC:2,IRON:2,RETICCTPCT:2 in the last 72 hours  Studies/Results: Dg Chest 2 View  11/18/2011  *RADIOLOGY REPORT*  Clinical Data: The pacemaker implantation  CHEST - 2 VIEW  Comparison: 11/17/2011; 12/09/2009  Findings:  Grossly unchanged cardiac silhouette and mediastinal contours given patient rotation.  Interval placement of a left anterior chest wall dual lead pacemaker with tips overlying the expected location of the right atrium and ventricle.  Minimal increase in basilar heterogeneous opacities, right greater than left.  The lungs remain hyperexpanded.  There is blunting of the bilateral costophrenic angles suggestive of small bilateral pleural effusions.  No definite pneumothorax.  Grossly unchanged bones including mild scoliotic curvature of the thoracolumbar spine.  IMPRESSION: 1.  Interval placement of left anterior chest wall dual lead pacemaker without evidence of complication. 2.  Hyperexpanded lungs with bibasilar heterogeneous opacities, right greater than left, possibly atelectasis or scar.  Original Report Authenticated By: Waynard Reeds, M.D.   Dg Chest 2 View  11/17/2011  *RADIOLOGY REPORT*  Clinical Data: Weakness.  Elevated white blood cells.  History of congestive heart failure and atrial fibrillation.  CHEST - 2 VIEW  Comparison: Multiple priors, most recently 12/09/2009.  Findings: Compared to prior examination, there is increasing diffuse interstitial prominence.  However, there does not appear to be significant cephalization of the pulmonary vasculature.  No consolidative airspace disease.  Minimal blunting of the costophrenic sulci bilaterally consistent with small bilateral pleural effusions.  Mild diffuse bronchial thickening.  Mild cardiomegaly.  Atherosclerosis in the aorta. The patient is rotated to the left on today's exam, resulting in distortion of the mediastinal contours and reduced diagnostic sensitivity  and specificity for mediastinal pathology.  IMPRESSION:  1.  Small bilateral pleural effusions. 2.  Diffuse interstitial prominence, markedly increased compared to prior examination.  Given the lack of cephalization, this is felt unlikely to represent pulmonary edema. There is also mild bronchial wall thickening.  This may alternatively reflect an early atypical infectious process such as a viral bronchitis. 3.  Atherosclerosis.  Original Report Authenticated By: Florencia Reasons, M.D.     Assessment/Plan: #1 atrial fibrillation with tachybradycardia syndrome pacemaker postop day #1 doing well hopefully home tomorrow  #2 CLL clinically stable  #3 essential hypertension stable  #4 congestive heart failure secondary to diastolic dysfunction with EF 45-50% well compensated  #5 chronic kidney disease stable allograft  plan hopefully home tomorrow   LOS: 3 days   Brian Underwood A 11/18/2011, 3:34 PM

## 2011-11-18 NOTE — Progress Notes (Signed)
Subjective:  No CP/SOB  Objective:  Temp:  [97.8 F (36.6 C)-98.2 F (36.8 C)] 98.1 F (36.7 C) (01/26 0800) Pulse Rate:  [47-82] 74  (01/26 0800) Resp:  [20-26] 24  (01/26 0800) BP: (134-150)/(55-67) 134/67 mmHg (01/26 0800) SpO2:  [90 %-93 %] 93 % (01/26 0800) Weight:  [83.9 kg (184 lb 15.5 oz)-87.6 kg (193 lb 2 oz)] 87.6 kg (193 lb 2 oz) (01/26 0500) Weight change: 1.3 kg (2 lb 13.9 oz)  Intake/Output from previous day: 01/25 0701 - 01/26 0700 In: 990 [P.O.:940; I.V.:50] Out: 400 [Urine:400]  Intake/Output from this shift: Total I/O In: 100 [I.V.:100] Out: 100 [Urine:100]  Physical Exam: General appearance: alert, cooperative and appears stated age Neck: no adenopathy, no carotid bruit, no JVD, supple, symmetrical, trachea midline and thyroid not enlarged, symmetric, no tenderness/mass/nodules Lungs: clear to auscultation bilaterally Heart: regular rate and rhythm, S1, S2 normal, no murmur, click, rub or gallop Extremities: extremities normal, atraumatic, no cyanosis or edema  Lab Results: Results for orders placed during the hospital encounter of 11/15/11 (from the past 48 hour(s))  COMPREHENSIVE METABOLIC PANEL     Status: Abnormal   Collection Time   11/17/11  3:55 AM      Component Value Range Comment   Sodium 141  135 - 145 (mEq/L)    Potassium 4.4  3.5 - 5.1 (mEq/L)    Chloride 106  96 - 112 (mEq/L)    CO2 25  19 - 32 (mEq/L)    Glucose, Bld 94  70 - 99 (mg/dL)    BUN 39 (*) 6 - 23 (mg/dL)    Creatinine, Ser 1.61 (*) 0.50 - 1.35 (mg/dL)    Calcium 8.7  8.4 - 10.5 (mg/dL)    Total Protein 5.4 (*) 6.0 - 8.3 (g/dL)    Albumin 3.0 (*) 3.5 - 5.2 (g/dL)    AST 21  0 - 37 (U/L)    ALT 21  0 - 53 (U/L)    Alkaline Phosphatase 56  39 - 117 (U/L)    Total Bilirubin 0.5  0.3 - 1.2 (mg/dL)    GFR calc non Af Amer 33 (*) >90 (mL/min)    GFR calc Af Amer 39 (*) >90 (mL/min)   CBC     Status: Abnormal   Collection Time   11/17/11  3:55 AM      Component Value Range  Comment   WBC 22.8 (*) 4.0 - 10.5 (K/uL)    RBC 3.42 (*) 4.22 - 5.81 (MIL/uL)    Hemoglobin 11.9 (*) 13.0 - 17.0 (g/dL)    HCT 09.6 (*) 04.5 - 52.0 (%)    MCV 105.6 (*) 78.0 - 100.0 (fL)    MCH 34.8 (*) 26.0 - 34.0 (pg)    MCHC 33.0  30.0 - 36.0 (g/dL)    RDW 40.9  81.1 - 91.4 (%)    Platelets 94 (*) 150 - 400 (K/uL) CONSISTENT WITH PREVIOUS RESULT  DIFFERENTIAL     Status: Abnormal   Collection Time   11/17/11  3:55 AM      Component Value Range Comment   Neutrophils Relative 15 (*) 43 - 77 (%)    Lymphocytes Relative 82 (*) 12 - 46 (%)    Monocytes Relative 2 (*) 3 - 12 (%)    Eosinophils Relative 1  0 - 5 (%)    Basophils Relative 0  0 - 1 (%)    Neutro Abs 3.4  1.7 - 7.7 (K/uL)    Lymphs  Abs 18.7 (*) 0.7 - 4.0 (K/uL)    Monocytes Absolute 0.5  0.1 - 1.0 (K/uL)    Eosinophils Absolute 0.2  0.0 - 0.7 (K/uL)    Basophils Absolute 0.0  0.0 - 0.1 (K/uL)    WBC Morphology ATYPICAL LYMPHOCYTES     PROTIME-INR     Status: Normal   Collection Time   11/17/11  2:29 PM      Component Value Range Comment   Prothrombin Time 14.9  11.6 - 15.2 (seconds)    INR 1.15  0.00 - 1.49    COMPREHENSIVE METABOLIC PANEL     Status: Abnormal   Collection Time   11/18/11  5:00 AM      Component Value Range Comment   Sodium 142  135 - 145 (mEq/L)    Potassium 4.3  3.5 - 5.1 (mEq/L)    Chloride 109  96 - 112 (mEq/L)    CO2 23  19 - 32 (mEq/L)    Glucose, Bld 85  70 - 99 (mg/dL)    BUN 34 (*) 6 - 23 (mg/dL)    Creatinine, Ser 1.61 (*) 0.50 - 1.35 (mg/dL)    Calcium 8.3 (*) 8.4 - 10.5 (mg/dL)    Total Protein 5.3 (*) 6.0 - 8.3 (g/dL)    Albumin 2.8 (*) 3.5 - 5.2 (g/dL)    AST 21  0 - 37 (U/L)    ALT 15  0 - 53 (U/L)    Alkaline Phosphatase 57  39 - 117 (U/L)    Total Bilirubin 0.5  0.3 - 1.2 (mg/dL)    GFR calc non Af Amer 42 (*) >90 (mL/min)    GFR calc Af Amer 49 (*) >90 (mL/min)   CBC     Status: Abnormal   Collection Time   11/18/11  5:00 AM      Component Value Range Comment   WBC 22.5  (*) 4.0 - 10.5 (K/uL)    RBC 3.47 (*) 4.22 - 5.81 (MIL/uL)    Hemoglobin 11.9 (*) 13.0 - 17.0 (g/dL)    HCT 09.6 (*) 04.5 - 52.0 (%)    MCV 104.9 (*) 78.0 - 100.0 (fL)    MCH 34.3 (*) 26.0 - 34.0 (pg)    MCHC 32.7  30.0 - 36.0 (g/dL)    RDW 40.9  81.1 - 91.4 (%)    Platelets 101 (*) 150 - 400 (K/uL) CONSISTENT WITH PREVIOUS RESULT    Imaging: Imaging results have been reviewed  Assessment/Plan:   1. Principal Problem: 2.  *Abdominal pain 3. Active Problems: 4.  AF (atrial fibrillation) 5.  LBBB (left bundle branch block) 6.  Sinus bradycardia 7.  AAA (abdominal aortic aneurysm), 5.1 X 4.7 8.  Nephrolithiasis 9.  CLL (chronic lymphoblastic leukemia) 10.  Diverticulosis 11.  AI (aortic insufficiency), 2D 2003 12.  HTN (hypertension) 13.   Time Spent Directly with Patient:  20 minutes  Length of Stay:  LOS: 3 days  S/P Dual chamber PTTPM by Dr. Mitchell Heir yesterday for tachybrady syndrome.. Looks great! Pacing. PCXR ok. Exam benign. OK to transfer to tele. Ambulate. Hopefully home tomorrow. Needs ischemia w/u per Dr. Salena Saner as an OP secondary to decreased EF and monomorphic VT.  Brian Underwood 11/18/2011, 10:57 AM

## 2011-11-19 LAB — COMPREHENSIVE METABOLIC PANEL
ALT: 10 U/L (ref 0–53)
AST: 18 U/L (ref 0–37)
CO2: 23 mEq/L (ref 19–32)
Chloride: 107 mEq/L (ref 96–112)
GFR calc non Af Amer: 47 mL/min — ABNORMAL LOW (ref >90)
Potassium: 4.2 mEq/L (ref 3.5–5.1)
Sodium: 141 mEq/L (ref 135–145)
Total Bilirubin: 0.6 mg/dL (ref 0.3–1.2)

## 2011-11-19 LAB — CBC
MCH: 35 pg — ABNORMAL HIGH (ref 26.0–34.0)
MCHC: 33.7 g/dL (ref 30.0–36.0)
Platelets: 101 10*3/uL — ABNORMAL LOW (ref 150–400)

## 2011-11-19 MED ORDER — METOPROLOL TARTRATE 25 MG PO TABS: 25.0000 mg | ORAL_TABLET | Freq: Two times a day (BID) | ORAL | Status: DC

## 2011-11-19 MED ORDER — AMLODIPINE BESYLATE 2.5 MG PO TABS: 2.5000 mg | ORAL_TABLET | Freq: Every day | ORAL | Status: DC

## 2011-11-19 NOTE — Progress Notes (Signed)
Subjective:  No CP/SOB  Objective:  Temp:  [97.3 F (36.3 C)-98.2 F (36.8 C)] 97.3 F (36.3 C) (01/27 0445) Pulse Rate:  [70-79] 79  (01/27 0645) Resp:  [16-24] 21  (01/27 0445) BP: (143-187)/(59-86) 159/72 mmHg (01/27 0645) SpO2:  [93 %-94 %] 93 % (01/27 0445) Weight:  [86.1 kg (189 lb 13.1 oz)] 86.1 kg (189 lb 13.1 oz) (01/27 0445) Weight change: 2.2 kg (4 lb 13.6 oz)  Intake/Output from previous day: 01/26 0701 - 01/27 0700 In: 1630 [P.O.:1080; I.V.:550] Out: 1125 [Urine:1125]  Intake/Output from this shift: Total I/O In: 120 [P.O.:120] Out: 400 [Urine:400]  Physical Exam: General appearance: alert and cooperative Neck: no adenopathy, no carotid bruit, no JVD, supple, symmetrical, trachea midline and thyroid not enlarged, symmetric, no tenderness/mass/nodules Lungs: clear to auscultation bilaterally Heart: regular rate and rhythm, S1, S2 normal, no murmur, click, rub or gallop Extremities: extremities normal, atraumatic, no cyanosis or edema  Lab Results: Results for orders placed during the hospital encounter of 11/15/11 (from the past 48 hour(s))  PROTIME-INR     Status: Normal   Collection Time   11/17/11  2:29 PM      Component Value Range Comment   Prothrombin Time 14.9  11.6 - 15.2 (seconds)    INR 1.15  0.00 - 1.49    COMPREHENSIVE METABOLIC PANEL     Status: Abnormal   Collection Time   11/18/11  5:00 AM      Component Value Range Comment   Sodium 142  135 - 145 (mEq/L)    Potassium 4.3  3.5 - 5.1 (mEq/L)    Chloride 109  96 - 112 (mEq/L)    CO2 23  19 - 32 (mEq/L)    Glucose, Bld 85  70 - 99 (mg/dL)    BUN 34 (*) 6 - 23 (mg/dL)    Creatinine, Ser 8.29 (*) 0.50 - 1.35 (mg/dL)    Calcium 8.3 (*) 8.4 - 10.5 (mg/dL)    Total Protein 5.3 (*) 6.0 - 8.3 (g/dL)    Albumin 2.8 (*) 3.5 - 5.2 (g/dL)    AST 21  0 - 37 (U/L)    ALT 15  0 - 53 (U/L)    Alkaline Phosphatase 57  39 - 117 (U/L)    Total Bilirubin 0.5  0.3 - 1.2 (mg/dL)    GFR calc non Af Amer 42  (*) >90 (mL/min)    GFR calc Af Amer 49 (*) >90 (mL/min)   CBC     Status: Abnormal   Collection Time   11/18/11  5:00 AM      Component Value Range Comment   WBC 22.5 (*) 4.0 - 10.5 (K/uL)    RBC 3.47 (*) 4.22 - 5.81 (MIL/uL)    Hemoglobin 11.9 (*) 13.0 - 17.0 (g/dL)    HCT 56.2 (*) 13.0 - 52.0 (%)    MCV 104.9 (*) 78.0 - 100.0 (fL)    MCH 34.3 (*) 26.0 - 34.0 (pg)    MCHC 32.7  30.0 - 36.0 (g/dL)    RDW 86.5  78.4 - 69.6 (%)    Platelets 101 (*) 150 - 400 (K/uL) CONSISTENT WITH PREVIOUS RESULT  COMPREHENSIVE METABOLIC PANEL     Status: Abnormal   Collection Time   11/19/11  5:00 AM      Component Value Range Comment   Sodium 141  135 - 145 (mEq/L)    Potassium 4.2  3.5 - 5.1 (mEq/L)    Chloride 107  96 -  112 (mEq/L)    CO2 23  19 - 32 (mEq/L)    Glucose, Bld 87  70 - 99 (mg/dL)    BUN 28 (*) 6 - 23 (mg/dL)    Creatinine, Ser 1.61  0.50 - 1.35 (mg/dL)    Calcium 8.4  8.4 - 10.5 (mg/dL)    Total Protein 5.3 (*) 6.0 - 8.3 (g/dL)    Albumin 2.8 (*) 3.5 - 5.2 (g/dL)    AST 18  0 - 37 (U/L)    ALT 10  0 - 53 (U/L)    Alkaline Phosphatase 57  39 - 117 (U/L)    Total Bilirubin 0.6  0.3 - 1.2 (mg/dL)    GFR calc non Af Amer 47 (*) >90 (mL/min)    GFR calc Af Amer 55 (*) >90 (mL/min)   CBC     Status: Abnormal   Collection Time   11/19/11  5:00 AM      Component Value Range Comment   WBC 23.3 (*) 4.0 - 10.5 (K/uL)    RBC 3.63 (*) 4.22 - 5.81 (MIL/uL)    Hemoglobin 12.7 (*) 13.0 - 17.0 (g/dL)    HCT 09.6 (*) 04.5 - 52.0 (%)    MCV 103.9 (*) 78.0 - 100.0 (fL)    MCH 35.0 (*) 26.0 - 34.0 (pg)    MCHC 33.7  30.0 - 36.0 (g/dL)    RDW 40.9  81.1 - 91.4 (%)    Platelets 101 (*) 150 - 400 (K/uL) CONSISTENT WITH PREVIOUS RESULT    Imaging: Imaging results have been reviewed  Assessment/Plan:   1. Principal Problem: 2.  *Abdominal pain 3. Active Problems: 4.  AF (atrial fibrillation) 5.  LBBB (left bundle branch block) 6.  Sinus bradycardia 7.  AAA (abdominal aortic aneurysm),  5.1 X 4.7 8.  Nephrolithiasis 9.  CLL (chronic lymphoblastic leukemia) 10.  Diverticulosis 11.  AI (aortic insufficiency), 2D 2003 12.  HTN (hypertension) 13.   Time Spent Directly with Patient:  20 minutes  Length of Stay:  LOS: 4 days   S/P dual chamber PTVPM paced Friday by Dr. Royann Shivers for tachybrady syndrome. Pacing appropriately. CXR ok. Can be D/Cd home today. ROV with Dr. Salena Saner 1-2 weeks.  Runell Gess 11/19/2011, 8:49 AM

## 2011-11-19 NOTE — Progress Notes (Signed)
DC'd pt home with family.  RN reviewed DC instructions and med rec with patient. RN removed IV with catheter intact and pt verbalized follow up appointment with MD.

## 2011-11-19 NOTE — Discharge Summary (Signed)
DISCHARGE SUMMARY  [name]  MR#: [id number]  DOB:09/20/1918  Date of Admission: 11/15/2011 Date of Discharge: 11/19/2011  Attending Physician:Shavonta Gossen Underwood  Patient's NWG:[id number],YQMV A, MD, MD  Consults:Treatment Team:  Lennette Bihari, MD Ezequiel Kayser, MD  Discharge Diagnoses: Principal Problem:  *Abdominal pain Active Problems:  AF (atrial fibrillation)  LBBB (left bundle branch block)  Sinus bradycardia  AAA (abdominal aortic aneurysm), 5.1 X 4.7  Nephrolithiasis  CLL (chronic lymphoblastic leukemia)  Diverticulosis  AI (aortic insufficiency), 2D 2003  HTN (hypertension)   Discharge Medications: Medication List  As of 11/19/2011 12:45 PM   TAKE these medications         amLODipine 2.5 MG tablet   Commonly known as: NORVASC   Take 1 tablet (2.5 mg total) by mouth daily.      chlorthalidone 50 MG tablet   Commonly known as: HYGROTON   Take 50 mg by mouth daily.      cholecalciferol 1000 UNITS tablet   Commonly known as: VITAMIN D   Take 2,000 Units by mouth daily.      ezetimibe-simvastatin 10-20 MG per tablet   Commonly known as: VYTORIN   Take 0.5 tablets by mouth at bedtime.      fish oil-omega-3 fatty acids 1000 MG capsule   Take 1 g by mouth daily.      Glucosamine-Chondroit-Biofl-Mn Caps   Take 1 capsule by mouth 2 (two) times daily.      metoprolol tartrate 25 MG tablet   Commonly known as: LOPRESSOR   Take 1 tablet (25 mg total) by mouth 2 (two) times daily.      potassium chloride 10 MEQ tablet   Commonly known as: K-DUR   Take 10 mEq by mouth 2 (two) times daily.      testosterone cypionate 100 MG/ML injection   Commonly known as: DEPOTESTOTERONE CYPIONATE   Inject 200 mg into the muscle every 6 (six) weeks. For IM use only. Inject in left gluteal.      testosterone cypionate 100 MG/ML injection   Commonly known as: DEPOTESTOTERONE CYPIONATE   Inject 400 mg into the muscle every 6 (six) weeks. For IM use only. Inject into the  right gluteal.      TYLENOL 325 MG tablet   Generic drug: acetaminophen   Take 650 mg by mouth 2 (two) times daily.      zaleplon 5 MG capsule   Commonly known as: SONATA   Take 5 mg by mouth at bedtime.            Hospital Procedures: Dg Chest 2 View  11/18/2011  *RADIOLOGY REPORT*  Clinical Data: The pacemaker implantation  CHEST - 2 VIEW  Comparison: 11/17/2011; 12/09/2009  Findings:  Grossly unchanged cardiac silhouette and mediastinal contours given patient rotation.  Interval placement of Underwood left anterior chest wall dual lead pacemaker with tips overlying the expected location of the right atrium and ventricle.  Minimal increase in basilar heterogeneous opacities, right greater than left.  The lungs remain hyperexpanded.  There is blunting of the bilateral costophrenic angles suggestive of small bilateral pleural effusions.  No definite pneumothorax.  Grossly unchanged bones including mild scoliotic curvature of the thoracolumbar spine.  IMPRESSION: 1.  Interval placement of left anterior chest wall dual lead pacemaker without evidence of complication. 2.  Hyperexpanded lungs with bibasilar heterogeneous opacities, right greater than left, possibly atelectasis or scar.  Original Report Authenticated By: Waynard Reeds, M.D.   Dg Chest 2 View  11/17/2011  *RADIOLOGY REPORT*  Clinical Data: Weakness.  Elevated white blood cells.  History of congestive heart failure and atrial fibrillation.  CHEST - 2 VIEW  Comparison: Multiple priors, most recently 12/09/2009.  Findings: Compared to prior examination, there is increasing diffuse interstitial prominence.  However, there does not appear to be significant cephalization of the pulmonary vasculature.  No consolidative airspace disease.  Minimal blunting of the costophrenic sulci bilaterally consistent with small bilateral pleural effusions.  Mild diffuse bronchial thickening.  Mild cardiomegaly.  Atherosclerosis in the aorta. The patient is  rotated to the left on today's exam, resulting in distortion of the mediastinal contours and reduced diagnostic sensitivity and specificity for mediastinal pathology.  IMPRESSION:  1.  Small bilateral pleural effusions. 2.  Diffuse interstitial prominence, markedly increased compared to prior examination.  Given the lack of cephalization, this is felt unlikely to represent pulmonary edema. There is also mild bronchial wall thickening.  This may alternatively reflect an early atypical infectious process such as Underwood viral bronchitis. 3.  Atherosclerosis.  Original Report Authenticated By: Florencia Reasons, M.D.   US Aorta  11/15/2011  *RADIOLOGY REPORT*  Clinical Data:  Evaluate known abdominal aortic aneurysm.  ULTRASOUND OF ABDOMINAL AORTA  Technique:  Ultrasound examination of the abdominal aorta was performed to evaluate for abdominal aortic aneurysm.  Comparison: CT of the abdomen and pelvis performed 02/16/2011  Abdominal Aorta:  The patient's known abdominal aortic aneurysm is again seen; it has increased mildly in size from the prior study. On the prior study, it measured 5.2 cm in AP dimension and 5.2 cm in transverse dimension.        Maximum AP diameter:  5.5 cm.       Maximum TRV diameter:  5.5 cm.  There is mild luminal narrowing distally due to intramural thrombus and associated calcification.  The proximal abdominal aorta is not characterized; as noted on the prior CT, the fusiform aneurysm is somewhat tortuous with multiple dilated segments, though per the prior CT it remains normal in caliber at the level of the renal arteries.  Incidental note is made of two large left renal cysts, also noted on the prior CT.  IMPRESSION:  1.  Mild interval increase in size of abdominal aortic aneurysm, now measuring 5.5 cm in AP dimension and 5.5 cm in transverse dimension.  Mild luminal narrowing noted distally due to intramural thrombus and associated calcification.  The fusiform aneurysm is somewhat tortuous  in appearance, as on prior CT. 2.  Two large left renal cysts again seen.  Original Report Authenticated By: Tonia Ghent, M.D.    History of Present Illness: Brian Underwood is Underwood pleasant and very functional 78 year old gentleman who has 4-5 days of symptoms. He has generally not felt well. He has had intermittent shaking and jerking spells. He has had some vague lower abdominal discomfort. He was seen in our office two days ago. Labs were unrevealing. He was seen today. Vital signs were stable. He was Underwood bit clammy. He was found to be in atrial fibrillation which is Underwood new diagnosis for him. He was given Underwood shot of rocephin in the office empirically to cover possible urinary source or diverticulosis as he had issues with these things in the past. However, he has had no blood from above or below. He denies cp, sob or true fever. He has had two nL bm per day this week (usual is one). At the present time he has no discomfort. He will be  admitted for all of these symptoms and signs.    Hospital Course: Patient was admitted placed on monitoring ultimately his GI symptoms resolved and improved. The issue that was most impacting was his tachybradycardia syndrome as Underwood result of management of his atrial fibrillation. He ultimately underwent placement of Underwood pacemaker in January 25 without complications. He was observed for an additional day in the unit and transferred to the floor. He resume normal activity and was eating quite well. Pacemaker is functioning well and he has been cleared by cardiology therefore discharged for further as an outpatient.  Day of Discharge Exam BP 159/72  Pulse 79  Temp(Src) 97.3 F (36.3 C) (Oral)  Resp 21  Ht 6\' 2"  (1.88 m)  Wt 86.1 kg (189 lb 13.1 oz)  BMI 24.37 kg/m2  SpO2 93%  Physical Exam: General appearance: alert, cooperative and no distress Eyes: no scleral icterus Throat: oropharynx moist without erythema Resp: clear to auscultation bilaterally Cardio: regular rate and  rhythm, S1, S2 normal, no murmur, click, rub or gallop Extremities: no clubbing, cyanosis or edema  Discharge Labs:  [facility] 11/19/11 0500 11/18/11 0500  NA 141 142  K 4.2 4.3  CL 107 109  CO2 23 23  GLUCOSE 87 85  BUN 28* 34*  CREATININE 1.28 1.40*  CALCIUM 8.4 8.3*  MG -- --  PHOS -- --    Basename 11/19/11 0500 11/18/11 0500  AST 18 21  ALT 10 15  ALKPHOS 57 57  BILITOT 0.6 0.5  PROT 5.3* 5.3*  ALBUMIN 2.8* 2.8*    Basename 11/19/11 0500 11/18/11 0500 11/17/11 0355  WBC 23.3* 22.5* --  NEUTROABS -- -- 3.4  HGB 12.7* 11.9* --  HCT 37.7* 36.4* --  MCV 103.9* 104.9* --  PLT 101* 101* --   No results found for this basename: CKTOTAL:3,CKMB:3,CKMBINDEX:3,TROPONINI:3 in the last 72 hours No results found for this basename: TSH,T4TOTAL,FREET3,T3FREE,THYROIDAB in the last 72 hours No results found for this basename: VITAMINB12:2,FOLATE:2,FERRITIN:2,TIBC:2,IRON:2,RETICCTPCT:2 in the last 72 hours  Discharge instructions: Discharge Orders    Future Appointments: Provider: Department: Dept Phone: Center:   12/11/2011 9:00 AM [facility] [phone] [facility]   03/05/2012 11:15 AM Leighton Ruff Womack [facility] [phone] None   03/05/2012 11:45 AM Lucile Shutters, MD [facility] [phone] None     Future Orders Please Complete By Expires   Diet - low sodium heart healthy      Increase activity slowly         Disposition: Home  Follow-up Appts: Follow-up with Dr. Sherrye Payor at [facility] in 1 week and [facility] heart and vascular as scheduled.  Call for appointment.  Condition on Discharge: Improved  Tests Needing Follow-up: None  Signed: Quintus Premo Underwood 11/19/2011, 12:45 PM

## 2011-11-22 ENCOUNTER — Encounter: Payer: Self-pay | Admitting: Vascular Surgery

## 2011-11-23 ENCOUNTER — Ambulatory Visit (INDEPENDENT_AMBULATORY_CARE_PROVIDER_SITE_OTHER): Payer: Medicare PPO | Admitting: Vascular Surgery

## 2011-11-23 ENCOUNTER — Encounter: Payer: Self-pay | Admitting: Vascular Surgery

## 2011-11-23 VITALS — BP 140/66 | HR 70 | Resp 16 | Ht 73.0 in | Wt 189.0 lb

## 2011-11-23 DIAGNOSIS — I714 Abdominal aortic aneurysm, without rupture, unspecified: Secondary | ICD-10-CM | POA: Insufficient documentation

## 2011-11-23 NOTE — Progress Notes (Signed)
[facility] [place]   Patient name: Brian Underwood MRN: [id number] DOB: Feb 26, 1918 Sex: male   Referred by: Waynard Edwards  Reason for referral:  Chief Complaint  Patient presents with  . Follow-up  . AAA    HISTORY OF PRESENT ILLNESS: The patient presents today for continued followup of his infrarenal abdominal aortic aneurysm. He recently was admitted to [facility] with a cardia and required a transvenous pacemaker insertion. He has done well with this. He continues to be quite active despite his age of 78. He runs a [employer] and lives independently with his wife.  Past Medical History  Diagnosis Date  . Renal disorder   . Abnormal involuntary movements   . Viral infection   . Hypogonadism male   . Fatigue   . Orthostatic lightheadedness   . Acute diarrhea   . Abdominal pain of unknown etiology   . Osteopenia   . Microalbuminuria   . Left bundle branch block   . Atrioventricular block, first degree   . Aortic insufficiency   . Abdominal aortic aneurysm   . History of benign prostatic hypertrophy   . Chronic renal disease, stage III   . Graves disease   . Hyperlipidemia   . Insomnia   . Health maintenance examination   . CLL (chronic lymphoblastic leukemia)   . Heart murmur   . Hypertension   . Arthritis   . Skin cancer of upper limb or shoulder     Right shoulder cancer removal  . Skin cancer of face     nose and forehead    Past Surgical History  Procedure Date  . Hernia repair   . Right foot   . Eye surgery   . Pace maker insertion     History   Social History  . Marital Status: Married    Spouse Name: N/A    Number of Children: N/A  . Years of Education: N/A   Occupational History  . Not on file.   Social History Main Topics  . Smoking status: Never Smoker   . Smokeless tobacco: Not on file  . Alcohol Use: Yes     occassionally  . Drug Use:   . Sexually Active:    Other Topics Concern  . Not on file     Social History Narrative  . No narrative on file    Family History  Problem Relation Age of Onset  . Stroke Mother   . Stroke Father   . Cancer Brother     Allergies as of 11/23/2011 - Review Complete 11/23/2011  Allergen Reaction Noted  . Altace  11/15/2011  . Aspirin  11/15/2011  . Lexapro  11/15/2011    Current Outpatient Prescriptions on File Prior to Visit  Medication Sig Dispense Refill  . acetaminophen (TYLENOL) 325 MG tablet Take 650 mg by mouth 2 (two) times daily.      Marland Kitchen amLODipine (NORVASC) 2.5 MG tablet Take 1 tablet (2.5 mg total) by mouth daily.  30 tablet  3  . chlorthalidone (HYGROTON) 50 MG tablet Take 50 mg by mouth daily.      . cholecalciferol (VITAMIN D) 1000 UNITS tablet Take 2,000 Units by mouth daily.      Marland Kitchen ezetimibe-simvastatin (VYTORIN) 10-20 MG per tablet Take 0.5 tablets by mouth at bedtime.      . fish oil-omega-3 fatty acids 1000 MG capsule Take 1 g by mouth daily.      Marland Kitchen Glucosamine-Chondroit-Biofl-Mn CAPS Take 1  capsule by mouth 2 (two) times daily.      . metoprolol tartrate (LOPRESSOR) 25 MG tablet Take 1 tablet (25 mg total) by mouth 2 (two) times daily.  60 tablet  3  . potassium chloride (K-DUR) 10 MEQ tablet Take 10 mEq by mouth 2 (two) times daily.      Marland Kitchen testosterone cypionate (DEPOTESTOTERONE CYPIONATE) 100 MG/ML injection Inject 200 mg into the muscle every 6 (six) weeks. For IM use only. Inject in left gluteal.      . testosterone cypionate (DEPOTESTOTERONE CYPIONATE) 100 MG/ML injection Inject 400 mg into the muscle every 6 (six) weeks. For IM use only. Inject into the right gluteal.      . zaleplon (SONATA) 5 MG capsule Take 5 mg by mouth at bedtime.         REVIEW OF SYSTEMS:  No change from prior visit.  PHYSICAL EXAMINATION:  General: The patient is a well-nourished male, in no acute distress. Vital signs are BP 140/66  Pulse 70  Resp 16  Ht 6\' 1"  (1.854 m)  Wt 189 lb (85.73 kg)  BMI 24.94 kg/m2 Pulmonary: There is a  good air exchange bilaterally without wheezing or rales. Abdomen: Soft and non-tender with normal pitch bowel sounds. He does have an easily palpable abdominal aortic aneurysm which is nontender Musculoskeletal: There are no major deformities.  There is no significant extremity pain. Neurologic: No focal weakness or paresthesias are detected, Skin: There are no ulcer or rashes noted. Psychiatric: The patient has normal affect. Cardiovascular: There is a regular rate and rhythm without significant murmur appreciated. Pulse status: 2+ radial femoral popliteal and posterior tibial pulses. Popliteal pulses are prominent but did not feel aneurysmal  Aortic ultrasound: I reviewed his films from [facility] earlier this month. This reveals 5.5 cm aneurysm  Impression and Plan:  5.5 cm asymptomatic aneurysm and 78 year old active gentleman. I reviewed his prior CT scan from April of 2012. This was a noncontrast study due his renal insufficiency. This does show a low takeoff of his right renal artery and does not appear that he would be a candidate for stent grafting. The April 2012 CT shows a maximal diameter 5.2 cm. I discussed this at length with the patient. Explained that he certainly is significant perioperative risk for an open aneurysm repair do to his cardiac and renal dysfunction. I explained the magnitude of open aneurysm repair. Certainly would recommend continued observation due to his advanced age and high risk for open surgery. I did predict approximately 5% per year risk of rupture in his current aneurysm size. We will see him again in 6 months to repeat his ultrasound to rule out rapid expansion. He does report immediately to the emergency department should he have symptoms suggesting leaking aneurysm    Brian Underwood F [facility] [place] Office: [phone]

## 2011-11-28 ENCOUNTER — Ambulatory Visit: Payer: Medicare PPO | Admitting: Vascular Surgery

## 2011-12-11 ENCOUNTER — Other Ambulatory Visit: Payer: Medicare PPO

## 2012-03-01 ENCOUNTER — Other Ambulatory Visit: Payer: Self-pay | Admitting: *Deleted

## 2012-03-01 DIAGNOSIS — C9110 Chronic lymphocytic leukemia of B-cell type not having achieved remission: Secondary | ICD-10-CM

## 2012-03-05 ENCOUNTER — Ambulatory Visit (HOSPITAL_BASED_OUTPATIENT_CLINIC_OR_DEPARTMENT_OTHER): Payer: Medicare PPO | Admitting: Oncology

## 2012-03-05 ENCOUNTER — Telehealth: Payer: Self-pay | Admitting: *Deleted

## 2012-03-05 ENCOUNTER — Other Ambulatory Visit (HOSPITAL_BASED_OUTPATIENT_CLINIC_OR_DEPARTMENT_OTHER): Payer: Medicare PPO | Admitting: Lab

## 2012-03-05 DIAGNOSIS — C9110 Chronic lymphocytic leukemia of B-cell type not having achieved remission: Secondary | ICD-10-CM

## 2012-03-05 DIAGNOSIS — Z23 Encounter for immunization: Secondary | ICD-10-CM

## 2012-03-05 DIAGNOSIS — Z832 Family history of diseases of the blood and blood-forming organs and certain disorders involving the immune mechanism: Secondary | ICD-10-CM

## 2012-03-05 DIAGNOSIS — D649 Anemia, unspecified: Secondary | ICD-10-CM

## 2012-03-05 LAB — CBC WITH DIFFERENTIAL/PLATELET
BASO%: 0.2 % (ref 0.0–2.0)
EOS%: 0.8 % (ref 0.0–7.0)
LYMPH%: 76.2 % — ABNORMAL HIGH (ref 14.0–49.0)
MCH: 33.8 pg — ABNORMAL HIGH (ref 27.2–33.4)
MCHC: 32.9 g/dL (ref 32.0–36.0)
MONO#: 0.5 10*3/uL (ref 0.1–0.9)
Platelets: 102 10*3/uL — ABNORMAL LOW (ref 140–400)
RBC: 3.79 10*6/uL — ABNORMAL LOW (ref 4.20–5.82)
WBC: 16.2 10*3/uL — ABNORMAL HIGH (ref 4.0–10.3)
lymph#: 12.3 10*3/uL — ABNORMAL HIGH (ref 0.9–3.3)
nRBC: 0 % (ref 0–0)

## 2012-03-05 MED ORDER — PNEUMOCOCCAL VAC POLYVALENT 25 MCG/0.5ML IJ INJ
0.5000 mL | INJECTION | Freq: Once | INTRAMUSCULAR | Status: AC
  Administered 2012-03-05: 0.5 mL via INTRAMUSCULAR
  Filled 2012-03-05: qty 0.5

## 2012-03-05 NOTE — Progress Notes (Signed)
   [facility]    OFFICE PROGRESS NOTE   INTERVAL HISTORY:   He returns as scheduled. He denies fever and night sweats. No recent infection. He has mild anorexia.  He recently underwent placement of a pacemaker. He reports a family history of "free bleeders ". He has a chronic history of nosebleeds. He is not aware of a specific bleeding diagnosis.  Objective:  Vital signs in last 24 hours:  Blood pressure 121/69, pulse 90, temperature 97.1 F (36.2 C), temperature source Oral, height 6\' 1"  (1.854 m), weight 179 lb 9.6 oz (81.466 kg).    HEENT: Oropharynx without visible mass, neck without mass Lymphatics: 1/2-1 cm soft mobile left submandibular versus high anterior cervical node, no other palpable cervical, supraclavicular, or inguinal nodes. Less than 1 cm mobile left axillary node. Resp: Lungs with inspiratory rhonchi at the posterior base bilaterally, no respiratory distress Cardio: Regular rate and rhythm GI: No hepatosplenomegaly, soft lobular fullness in the left mid abdomen. Nontender. Vascular:  No leg edema   Lab Results:  Lab Results  Component Value Date   WBC 16.2* 03/05/2012   HGB 12.8* 03/05/2012   HCT 38.9 03/05/2012   MCV 102.6* 03/05/2012   PLT 102* 03/05/2012   ANC 3.2   Medications: I have reviewed the patient's current medications.  Assessment/Plan: 1. Chronic lymphocytic leukemia, asymptomatic. 2. 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 The hemoglobin is stable.  A DAT was negative 09/08/2007. 3. History of a small left submandibular lymph node. 4. Thrombocytopenia-likely secondary to CLL-mild 5. Report of a family history of a bleeding disorder with a personal history of frequent nosebleeds   Disposition:  He remains asymptomatic from the CLL and stable from a hematologic standpoint. There is no indication for treating the CLL at present. He last received a pneumococcal vaccine in May of 2007. We administered a  pneumococcal vaccine today. He will remain up-to-date on the influenza vaccine.  He reports a family history of a bleeding disorder. He is not aware of a specific diagnosis. He has frequent nosebleeds. He may have von Willebrand's disease. We did not proceed with von Willebrand's testing today.   Thornton Papas, MD  03/05/2012  1:59 PM

## 2012-03-05 NOTE — Telephone Encounter (Signed)
gave patient appointment for 03-06-2012 starting at 8:00am printed out calendar and gave to the patient

## 2012-05-14 ENCOUNTER — Other Ambulatory Visit: Payer: Self-pay | Admitting: *Deleted

## 2012-05-14 DIAGNOSIS — I714 Abdominal aortic aneurysm, without rupture: Secondary | ICD-10-CM

## 2012-05-20 ENCOUNTER — Encounter: Payer: Self-pay | Admitting: Vascular Surgery

## 2012-05-21 ENCOUNTER — Encounter: Payer: Self-pay | Admitting: Vascular Surgery

## 2012-05-21 ENCOUNTER — Ambulatory Visit (INDEPENDENT_AMBULATORY_CARE_PROVIDER_SITE_OTHER): Payer: Medicare PPO | Admitting: Vascular Surgery

## 2012-05-21 ENCOUNTER — Encounter (INDEPENDENT_AMBULATORY_CARE_PROVIDER_SITE_OTHER): Payer: Medicare PPO | Admitting: *Deleted

## 2012-05-21 VITALS — BP 125/68 | HR 73 | Temp 97.9°F | Ht 73.0 in | Wt 182.0 lb

## 2012-05-21 DIAGNOSIS — I714 Abdominal aortic aneurysm, without rupture: Secondary | ICD-10-CM

## 2012-05-21 NOTE — Progress Notes (Signed)
The patient presents today for ultrasound followup of his abdominal aortic aneurysm. He continues to remain quite active despite his age of 55. He does report a unusual complex of cramping in his abdomen but denies any abdominal pain associated with this. He is also had several recent episodes of shortness of breath that resolves spontaneously. I do not feel any these are related to his aneurysm. No new cardiac difficulty and is stable from a general medical standpoint  Past Medical History  Diagnosis Date  . Renal disorder   . Abnormal involuntary movements   . Viral infection   . Hypogonadism male   . Fatigue   . Orthostatic lightheadedness   . Acute diarrhea   . Abdominal pain of unknown etiology   . Osteopenia   . Microalbuminuria   . Left bundle branch block   . Atrioventricular block, first degree   . Aortic insufficiency   . Abdominal aortic aneurysm   . History of benign prostatic hypertrophy   . Chronic renal disease, stage III   . Graves disease   . Hyperlipidemia   . Insomnia   . Health maintenance examination   . CLL (chronic lymphoblastic leukemia)   . Heart murmur   . Hypertension   . Arthritis   . Skin cancer of upper limb or shoulder     Right shoulder cancer removal  . Skin cancer of face     nose and forehead    History  Substance Use Topics  . Smoking status: Never Smoker   . Smokeless tobacco: Never Used  . Alcohol Use: Yes     occassionally    Family History  Problem Relation Age of Onset  . Stroke Mother   . Stroke Father   . Cancer Brother     Allergies  Allergen Reactions  . Aspirin   . Escitalopram Oxalate   . Ramipril     Current outpatient prescriptions:acetaminophen (TYLENOL) 325 MG tablet, Take 650 mg by mouth 2 (two) times daily., Disp: , Rfl: ;  amLODipine (NORVASC) 2.5 MG tablet, Take 1 tablet (2.5 mg total) by mouth daily., Disp: 30 tablet, Rfl: 3;  chlorthalidone (HYGROTON) 50 MG tablet, Take 50 mg by mouth daily., Disp: ,  Rfl: ;  cholecalciferol (VITAMIN D) 1000 UNITS tablet, Take 2,000 Units by mouth daily., Disp: , Rfl:  ezetimibe-simvastatin (VYTORIN) 10-20 MG per tablet, Take 0.5 tablets by mouth at bedtime., Disp: , Rfl: ;  fish oil-omega-3 fatty acids 1000 MG capsule, Take 3 g by mouth daily. , Disp: , Rfl: ;  Glucosamine-Chondroit-Biofl-Mn CAPS, Take 1 capsule by mouth 2 (two) times daily., Disp: , Rfl: ;  metoprolol tartrate (LOPRESSOR) 25 MG tablet, Take 1 tablet (25 mg total) by mouth 2 (two) times daily., Disp: 60 tablet, Rfl: 3 potassium chloride (K-DUR) 10 MEQ tablet, Take 10 mEq by mouth 2 (two) times daily., Disp: , Rfl: ;  testosterone cypionate (DEPOTESTOTERONE CYPIONATE) 100 MG/ML injection, Inject 400 mg into the muscle every 6 (six) weeks. For IM use only. Inject into the right gluteal., Disp: , Rfl: ;  zaleplon (SONATA) 5 MG capsule, Take 5 mg by mouth at bedtime., Disp: , Rfl:   BP 125/68  Pulse 73  Temp 97.9 F (36.6 C) (Oral)  Ht 6\' 1"  (1.854 m)  Wt 182 lb (82.555 kg)  BMI 24.01 kg/m2  SpO2 99%  Body mass index is 24.01 kg/(m^2).       Physical exam well-developed well-nourished white male in no acute distress Neurologically  he is grossly intact Pulse status 2+ radial pulses bilaterally 2+ femoral pulses and palpable popliteal pulses bilaterally without evidence of peripheral aneurysms. Abdomen soft with easier aneurysm which is nontender no other masses noted  Ultrasound today in our office reveals no significant change with maximal diameter of 5.4 cm.  Impression and plan stable infrarenal abdominal aortic aneurysm. Again discussed symptoms of leaking aneurysm with the patient will notify should this occur and her present immediately to [facility] room. Otherwise we'll see him in 6 months with repeat ultrasound

## 2012-05-21 NOTE — Addendum Note (Signed)
Addended by: Sharee Pimple on: 05/21/2012 02:49 PM   Modules accepted: Orders

## 2012-05-28 NOTE — Procedures (Unsigned)
DUPLEX ULTRASOUND OF ABDOMINAL AORTA  INDICATION:  Abdominal aortic aneurysm.  HISTORY: Diabetes:  No. Cardiac:  Murmur. Hypertension:  Yes. Smoking:  No. Connective Tissue Disorder: Family History:  Brother had cerebral aneurysm. Previous Surgery:  No.  DUPLEX EXAM:         AP (cm)                   TRANSVERSE (cm) Proximal             2.9 cm                    3.0 cm Mid                  3.5 cm                    3.8 cm Distal               5.2 cm                    5.4 cm Right Iliac          1.8 cm                    1.7 cm Left Iliac           1.7 cm                    1.5 cm  PREVIOUS:  Date: 05/31/2011  AP:  5.1  TRANSVERSE:  4.7  IMPRESSION: 1. Aneurysmal dilatation of the tortuous abdominal aorta with no     significant change in maximum diameter when compared to the     previous exam on 05/31/2011.  No hemodynamically significantly     increased velocities noted within the abdominal aorta and bilateral     proximal common iliac arteries. 2. Dilatation of the bilateral proximal common iliac arteries is     noted. 3. Incidental finding:  Two cystic structures, measuring > 8cm and     with no internal flow, noted anterior to the umbilical level     inferior vena cava and abdominal aorta.  ___________________________________________ Larina Earthly, M.D.  CH/MEDQ  D:  05/24/2012  T:  05/24/2012  Job:  [id number]

## 2012-06-05 ENCOUNTER — Encounter: Payer: Self-pay | Admitting: Internal Medicine

## 2012-06-11 ENCOUNTER — Encounter: Payer: Self-pay | Admitting: Internal Medicine

## 2012-06-12 ENCOUNTER — Ambulatory Visit: Payer: Medicare PPO | Admitting: Internal Medicine

## 2012-06-17 ENCOUNTER — Encounter: Payer: Self-pay | Admitting: Vascular Surgery

## 2012-06-18 ENCOUNTER — Encounter: Payer: Self-pay | Admitting: Vascular Surgery

## 2012-06-18 ENCOUNTER — Ambulatory Visit (INDEPENDENT_AMBULATORY_CARE_PROVIDER_SITE_OTHER): Payer: Medicare PPO | Admitting: Vascular Surgery

## 2012-06-18 VITALS — BP 132/69 | HR 69 | Resp 18 | Ht 72.0 in | Wt 185.5 lb

## 2012-06-18 DIAGNOSIS — I714 Abdominal aortic aneurysm, without rupture: Secondary | ICD-10-CM

## 2012-06-18 NOTE — Addendum Note (Signed)
Addended by: Sharee Pimple on: 06/18/2012 02:04 PM   Modules accepted: Orders

## 2012-06-18 NOTE — Progress Notes (Signed)
The patient presents today for followup of some aortic aneurysm. Had seen him approximately 3 weeks ago at which time his routine ultrasound duplex followup of his aneurysm revealed a 5.4 cm aneurysm. We have recommended continued observation. He continues to have a "queasy" sensation in the stomach and underwent a CT scan for further evaluation. He reports as a 7 cm aneurysm. He is here today for discussion the discrepancy from ultrasound and CT scan. I do have his actual CT images on CD.  Past Medical History  Diagnosis Date  . Renal disorder   . Abnormal involuntary movements   . Viral infection   . Hypogonadism male   . Fatigue   . Orthostatic lightheadedness   . Acute diarrhea   . Abdominal pain of unknown etiology   . Osteopenia   . Microalbuminuria   . Left bundle branch block   . Atrioventricular block, first degree   . Aortic insufficiency   . Abdominal aortic aneurysm   . History of benign prostatic hypertrophy   . Chronic renal disease, stage III   . Graves disease   . Hyperlipidemia   . Insomnia   . Health maintenance examination   . CLL (chronic lymphoblastic leukemia)   . Heart murmur   . Hypertension   . Arthritis   . Skin cancer of upper limb or shoulder     Right shoulder cancer removal  . Skin cancer of face     nose and forehead    History  Substance Use Topics  . Smoking status: Never Smoker   . Smokeless tobacco: Never Used  . Alcohol Use: Yes     occassionally    Family History  Problem Relation Age of Onset  . Stroke Mother   . Stroke Father   . Cancer Brother     Allergies  Allergen Reactions  . Aspirin   . Escitalopram Oxalate   . Ramipril     Current outpatient prescriptions:acetaminophen (TYLENOL) 325 MG tablet, Take 650 mg by mouth 2 (two) times daily., Disp: , Rfl: ;  amLODipine (NORVASC) 2.5 MG tablet, Take 1 tablet (2.5 mg total) by mouth daily., Disp: 30 tablet, Rfl: 3;  chlorthalidone (HYGROTON) 50 MG tablet, Take 50 mg by mouth  daily., Disp: , Rfl: ;  cholecalciferol (VITAMIN D) 1000 UNITS tablet, Take 2,000 Units by mouth daily., Disp: , Rfl:  ezetimibe-simvastatin (VYTORIN) 10-20 MG per tablet, Take 0.5 tablets by mouth at bedtime., Disp: , Rfl: ;  fish oil-omega-3 fatty acids 1000 MG capsule, Take 3 g by mouth daily. , Disp: , Rfl: ;  Glucosamine-Chondroit-Biofl-Mn CAPS, Take 1 capsule by mouth 2 (two) times daily., Disp: , Rfl: ;  metoprolol tartrate (LOPRESSOR) 25 MG tablet, Take 1 tablet (25 mg total) by mouth 2 (two) times daily., Disp: 60 tablet, Rfl: 3 potassium chloride (K-DUR) 10 MEQ tablet, Take 10 mEq by mouth 2 (two) times daily., Disp: , Rfl: ;  testosterone cypionate (DEPOTESTOTERONE CYPIONATE) 100 MG/ML injection, Inject 400 mg into the muscle every 6 (six) weeks. For IM use only. Inject into the right gluteal., Disp: , Rfl: ;  zaleplon (SONATA) 5 MG capsule, Take 5 mg by mouth at bedtime., Disp: , Rfl:   BP 132/69  Pulse 69  Resp 18  Ht 6' (1.829 m)  Wt 185 lb 8 oz (84.142 kg)  BMI 25.16 kg/m2  Body mass index is 25.16 kg/(m^2).       Physical exam no acute distress. He does have 2+ femoral and  2+ radial pulses. His abdomen is soft nontender. He does have an easily palpable aneurysm in this is nontender to palpation.  I reviewed his CT scan. The official report does suggest a 7 cm aneurysm. On my measurement with cursors on the actual films the maximal diameter I can obtain is 5.5 cm. I do not see any evidence that he has a 7 cm aneurysm on his CT scan.  Impression and plan 5 cm aneurysm. I feel there is an area of interpretation of the CT scan. He does have some tortuosity of the aorta but even taking this into account there is nothing that is near 7 cm in size. I discussed this with the patient who understands and we will continue his 6 month ultrasound followup as planned. I do not feel that this has any bearing on his abdominal complaints. He is to see GI service for followup

## 2012-07-04 ENCOUNTER — Encounter: Payer: Self-pay | Admitting: Internal Medicine

## 2012-07-04 ENCOUNTER — Encounter: Payer: Self-pay | Admitting: Cardiology

## 2012-07-30 ENCOUNTER — Encounter: Payer: Self-pay | Admitting: Internal Medicine

## 2012-07-31 ENCOUNTER — Ambulatory Visit (INDEPENDENT_AMBULATORY_CARE_PROVIDER_SITE_OTHER): Payer: Medicare PPO | Admitting: Internal Medicine

## 2012-07-31 ENCOUNTER — Encounter: Payer: Self-pay | Admitting: Internal Medicine

## 2012-07-31 VITALS — BP 110/70 | HR 80 | Ht 71.0 in | Wt 192.8 lb

## 2012-07-31 DIAGNOSIS — R1030 Lower abdominal pain, unspecified: Secondary | ICD-10-CM

## 2012-07-31 DIAGNOSIS — R109 Unspecified abdominal pain: Secondary | ICD-10-CM

## 2012-07-31 NOTE — Patient Instructions (Addendum)
We will review records from Dr. Waynard Edwards.  Follow up with Dr. Rhea Belton as needed

## 2012-08-01 ENCOUNTER — Encounter: Payer: Self-pay | Admitting: Internal Medicine

## 2012-08-01 NOTE — Progress Notes (Signed)
Patient ID: Brian Underwood, male   DOB: 11-May-1918, 78 y.o.   MRN: [id number]  SUBJECTIVE: HPI [name] is a 78 yo male with PMH of abdominal aortic aneurysm, aortic insufficiency, chronic renal insufficiency, hypertension, hyperlipidemia, CLL who seen in consultation at the request of Dr. Waynard Edwards for evaluation of lower abdominal pain. Patient is alone today. He reports lower abdominal "problems" dating back to around December 2012. During this time he was also having extreme fatigue and shortness of breath and was found to have bradycardia. He subsequently had a pacemaker placed in January 2013 which resolved some of his fatigue issues. He is continued to have lower abdominal pain in intermittent fashion. This pain is unpredictable and at times feels like a "grabbing or pulling" type pain across his lower abdomen from left to right. He reports this pain may last several days it does not seem to relate to eating or bowel movement. He also describes a separate "lightening" type pain across his lower abdomen which causes him to scream out. This pain is very unpredictable, but can be embarrassing for him. He states because of these episodes he stopped going to church. He denies change in weight. He reports overall a decreased appetite but no nausea or vomiting. He denies heartburn. He reports normal bowel habits with no blood in his stool nor melena.  He reports having one to 2 bowel movements a day which at times can be loose but this has been normal for him. No fevers or chills. He recalls remote flexible sigmoidoscopy but no prior colonoscopy to his knowledge.  Dr. Waynard Edwards did start him on a medication recently call Nuedexta, which he said has greatly improved his lower abdominal pain.  Since starting this medication he reports no lower abdominal pain either grabbing or pulling or lightening like.  He is very pleased with this new medication.  He recalls prior CT scans the last in August of 2013.  Review of  Systems  As per history of present illness, otherwise negative   Past Medical History  Diagnosis Date  . Renal disorder   . Abnormal involuntary movements   . Viral infection   . Hypogonadism male   . Fatigue   . Orthostatic lightheadedness   . Acute diarrhea   . Abdominal pain of unknown etiology   . Osteopenia   . Microalbuminuria   . Left bundle branch block   . Atrioventricular block, first degree   . Aortic insufficiency   . Abdominal aortic aneurysm   . History of benign prostatic hypertrophy   . Chronic renal disease, stage III   . Graves disease   . Hyperlipidemia   . Insomnia   . Health maintenance examination   . CLL (chronic lymphoblastic leukemia)   . Heart murmur   . Hypertension   . Arthritis   . Skin cancer of upper limb or shoulder     Right shoulder cancer removal  . Skin cancer of face     nose and forehead    Current Outpatient Prescriptions  Medication Sig Dispense Refill  . acetaminophen (TYLENOL) 325 MG tablet Take 650 mg by mouth 2 (two) times daily.      Marland Kitchen amLODipine (NORVASC) 2.5 MG tablet Take 1 tablet (2.5 mg total) by mouth daily.  30 tablet  3  . atenolol (TENORMIN) 100 MG tablet Take 100 mg by mouth daily.      . chlorthalidone (HYGROTON) 50 MG tablet Take 50 mg by mouth daily.      Marland Kitchen  cholecalciferol (VITAMIN D) 1000 UNITS tablet Take 2,000 Units by mouth daily.      Marland Kitchen Dextromethorphan-Quinidine (NUEDEXTA) 20-10 MG CAPS Take 20 mg by mouth daily.      Marland Kitchen ezetimibe-simvastatin (VYTORIN) 10-20 MG per tablet Take 0.5 tablets by mouth at bedtime.      . fish oil-omega-3 fatty acids 1000 MG capsule Take 3 g by mouth daily.       . Glucosamine-Chondroit-Biofl-Mn CAPS Take 1 capsule by mouth 2 (two) times daily.      . metoprolol tartrate (LOPRESSOR) 25 MG tablet Take 1 tablet (25 mg total) by mouth 2 (two) times daily.  60 tablet  3  . potassium chloride (K-DUR) 10 MEQ tablet Take 10 mEq by mouth 2 (two) times daily.      Marland Kitchen testosterone cypionate  (DEPOTESTOTERONE CYPIONATE) 100 MG/ML injection Inject 400 mg into the muscle every 6 (six) weeks. For IM use only. Inject into the right gluteal.      . vitamin B-12 (CYANOCOBALAMIN) 1000 MCG tablet Take 1,000 mcg by mouth daily.      . vitamin C (ASCORBIC ACID) 500 MG tablet Take 1,000 mg by mouth daily.      . zaleplon (SONATA) 5 MG capsule Take 5 mg by mouth at bedtime.        Allergies  Allergen Reactions  . Aspirin   . Escitalopram Oxalate   . Ramipril     Family History  Problem Relation Age of Onset  . Stroke Mother   . Stroke Father   . Cancer Brother     History  Substance Use Topics  . Smoking status: Never Smoker   . Smokeless tobacco: Never Used  . Alcohol Use: Yes     occassionally    OBJECTIVE: BP 110/70  Pulse 80  Ht 5\' 11"  (1.803 m)  Wt 192 lb 12.8 oz (87.454 kg)  BMI 26.89 kg/m2 Constitutional: Elderly appearing male in no acute distress HEENT: Normocephalic and atraumatic. Oropharynx is clear and moist. No oropharyngeal exudate. Conjunctivae are normal.  No scleral icterus. Neck: Neck supple. Trachea midline. Cardiovascular: Normal rate, regular rhythm and intact distal pulses. 2/6  murmur Pulmonary/chest: Effort normal and breath sounds normal. No wheezing, rales or rhonchi. Abdominal: Soft, nontender, nondistended. Bowel sounds active throughout.  No hepatosplenomegaly. Extremities: no clubbing, cyanosis, 1-2+ LE edema Neurological: Alert and oriented to person place and time. Skin: Skin is warm and dry. No rashes noted. Psychiatric: Normal mood and affect. Behavior is normal.  Labs and Imaging -- CT scan dated 06/07/2012 Findings 1. Large fusiform abdominal aortic aneurysm. 2. Rotated, migrated left kidney. Large cyst within the upper abdomen appear to be arising from the left kidney. Smaller right renal cysts and nonobstructing calcification noted. 3. Moderate left and right pleural effusion. Right lung base fibrotic change. Cardiac pacer wires  noted. 4. Sigmoid diverticulosis. 5. Mild prostatic enlargement. 6. Increased density of the subcutaneous and intra-abdominal fat, likely reflects third space fluids. 7. Severe lumbar scoliosis with advanced degenerative osseous and disc changes.  CBC    Component Value Date/Time   WBC 16.2* 03/05/2012 1120   WBC 23.3* 11/19/2011 0500   RBC 3.79* 03/05/2012 1120   RBC 3.63* 11/19/2011 0500   HGB 12.8* 03/05/2012 1120   HGB 12.7* 11/19/2011 0500   HCT 38.9 03/05/2012 1120   HCT 37.7* 11/19/2011 0500   PLT 102* 03/05/2012 1120   PLT 101* 11/19/2011 0500   MCV 102.6* 03/05/2012 1120   MCV 103.9* 11/19/2011 0500  MCH 33.8* 03/05/2012 1120   MCH 35.0* 11/19/2011 0500   MCHC 32.9 03/05/2012 1120   MCHC 33.7 11/19/2011 0500   RDW 15.7* 03/05/2012 1120   RDW 13.5 11/19/2011 0500   LYMPHSABS 12.3* 03/05/2012 1120   LYMPHSABS 18.7* 11/17/2011 0355   MONOABS 0.5 03/05/2012 1120   MONOABS 0.5 11/17/2011 0355   EOSABS 0.1 03/05/2012 1120   EOSABS 0.2 11/17/2011 0355   BASOSABS 0.0 03/05/2012 1120   BASOSABS 0.0 11/17/2011 0355   CMP     Component Value Date/Time   NA 141 11/19/2011 0500   K 4.2 11/19/2011 0500   CL 107 11/19/2011 0500   CO2 23 11/19/2011 0500   GLUCOSE 87 11/19/2011 0500   BUN 28* 11/19/2011 0500   CREATININE 1.28 11/19/2011 0500   CALCIUM 8.4 11/19/2011 0500   PROT 5.3* 11/19/2011 0500   ALBUMIN 2.8* 11/19/2011 0500   AST 18 11/19/2011 0500   ALT 10 11/19/2011 0500   ALKPHOS 57 11/19/2011 0500   BILITOT 0.6 11/19/2011 0500   GFRNONAA 47* 11/19/2011 0500   GFRAA 55* 11/19/2011 0500    ASSESSMENT AND PLAN: 78 yo male with PMH of abdominal aortic aneurysm, aortic insufficiency, chronic renal insufficiency, hypertension, hyperlipidemia, CLL who seen in consultation at the request of Dr. Waynard Edwards for evaluation of lower abdominal pain  1.  Intermittent lower abd pain/now improved -- the source for the patient's lower abdominal pain is unclear, but at present this issue has resolved.  He feels that it  has responded to Nuedexta.  I feel that colonoscopy would be low yield in providing an explanation for his pain. His abdominal aneurysm is followed by Dr. Arbie Cookey and felt to be stable in size making this a less likely cause for the pain.  He does have extensive diverticulosis, but no evidence of diverticulitis by recent CT scan. The pain is also not consistent with diverticulitis. His bowel habits have been regular without other alarm symptom. He can continue anti-spasmodic such as Levsin or Bentyl on an as-needed basis. If the pain returns, he is asked to notify us and we would consider further evaluation at that time based on his symptoms. He understands the recommendation and is in agreement.

## 2012-09-02 ENCOUNTER — Ambulatory Visit (HOSPITAL_COMMUNITY): Payer: Medicare PPO

## 2012-09-02 ENCOUNTER — Encounter (HOSPITAL_COMMUNITY): Payer: Self-pay | Admitting: Emergency Medicine

## 2012-09-02 ENCOUNTER — Emergency Department (HOSPITAL_COMMUNITY)
Admission: EM | Admit: 2012-09-02 | Discharge: 2012-09-02 | Disposition: A | Discharge status: Home / Self Care | Payer: Medicare PPO | Attending: Emergency Medicine | Admitting: Emergency Medicine

## 2012-09-02 ENCOUNTER — Emergency Department (HOSPITAL_COMMUNITY): Payer: Medicare PPO

## 2012-09-02 DIAGNOSIS — E785 Hyperlipidemia, unspecified: Secondary | ICD-10-CM | POA: Insufficient documentation

## 2012-09-02 DIAGNOSIS — M949 Disorder of cartilage, unspecified: Secondary | ICD-10-CM | POA: Insufficient documentation

## 2012-09-02 DIAGNOSIS — I129 Hypertensive chronic kidney disease with stage 1 through stage 4 chronic kidney disease, or unspecified chronic kidney disease: Secondary | ICD-10-CM | POA: Insufficient documentation

## 2012-09-02 DIAGNOSIS — Z79899 Other long term (current) drug therapy: Secondary | ICD-10-CM | POA: Insufficient documentation

## 2012-09-02 DIAGNOSIS — Y9389 Activity, other specified: Secondary | ICD-10-CM | POA: Insufficient documentation

## 2012-09-02 DIAGNOSIS — S0180XA Unspecified open wound of other part of head, initial encounter: Secondary | ICD-10-CM | POA: Insufficient documentation

## 2012-09-02 DIAGNOSIS — I4430 Unspecified atrioventricular block: Secondary | ICD-10-CM | POA: Insufficient documentation

## 2012-09-02 DIAGNOSIS — Z8739 Personal history of other diseases of the musculoskeletal system and connective tissue: Secondary | ICD-10-CM | POA: Insufficient documentation

## 2012-09-02 DIAGNOSIS — S0181XA Laceration without foreign body of other part of head, initial encounter: Secondary | ICD-10-CM

## 2012-09-02 DIAGNOSIS — Z8719 Personal history of other diseases of the digestive system: Secondary | ICD-10-CM | POA: Insufficient documentation

## 2012-09-02 DIAGNOSIS — E0500 Thyrotoxicosis with diffuse goiter without thyrotoxic crisis or storm: Secondary | ICD-10-CM | POA: Insufficient documentation

## 2012-09-02 DIAGNOSIS — Y92009 Unspecified place in unspecified non-institutional (private) residence as the place of occurrence of the external cause: Secondary | ICD-10-CM | POA: Insufficient documentation

## 2012-09-02 DIAGNOSIS — Z85828 Personal history of other malignant neoplasm of skin: Secondary | ICD-10-CM | POA: Insufficient documentation

## 2012-09-02 DIAGNOSIS — M899 Disorder of bone, unspecified: Secondary | ICD-10-CM | POA: Insufficient documentation

## 2012-09-02 DIAGNOSIS — Z8679 Personal history of other diseases of the circulatory system: Secondary | ICD-10-CM | POA: Insufficient documentation

## 2012-09-02 DIAGNOSIS — W1809XA Striking against other object with subsequent fall, initial encounter: Secondary | ICD-10-CM | POA: Insufficient documentation

## 2012-09-02 NOTE — ED Notes (Signed)
Patient states he bent over last yesterday and ended up falling completely over.  Patient also reports that his MD removed 17 lbs of fluid last week with Lasix for his edema.  Swelling noted in bilateral lower extremities.  Lip laceration approximately 2 cm laceration noted on upper lip.

## 2012-09-02 NOTE — ED Notes (Addendum)
Pt presenting to ed with c/o falling yesterday pt states he was in a hurry yesterday in the bathroom and he fell and hit his face on a wasetbasket. Pt states he was fine all night and he washed his face this morning he can't get the bleeding under control. Pt is alert and oriented at this time. Pt denies chest pain and dizziness at this time. Pt denies being on any blood thinners at this time

## 2012-09-02 NOTE — ED Notes (Signed)
MD at bedside. 

## 2012-09-02 NOTE — ED Provider Notes (Signed)
History     CSN: [id number]  Arrival date & time 09/02/12  1106   First MD Initiated Contact with Patient 09/02/12 1130      Chief Complaint  Patient presents with  . Fall  . Facial Laceration    (Consider location/radiation/quality/duration/timing/severity/associated sxs/prior treatment) HPI Patient presents to the emergency department, following a fall yesterday at home.  Patient, states, that he fell forward while leaning over to plug up the toilet seat and he struck his lip against the edge of the trashcan.  Patient, states he did not hit his head.  Patient denies loss consciousness, chest pain, shortness of breath, dizziness, visual changes, headache, syncope, weakness, numbness, or nausea, vomiting.  Patient, states the laceration of his lip and would not stop bleeding so he came to the emergency department.  Patient, states, that he should apply pressure to the wound without relief     f Past Medical History  Diagnosis Date  . Renal disorder   . Abnormal involuntary movements   . Viral infection   . Hypogonadism male   . Fatigue   . Orthostatic lightheadedness   . Acute diarrhea   . Abdominal pain of unknown etiology   . Osteopenia   . Microalbuminuria   . Left bundle branch block   . Atrioventricular block, first degree   . Aortic insufficiency   . Abdominal aortic aneurysm   . History of benign prostatic hypertrophy   . Chronic renal disease, stage III   . Graves disease   . Hyperlipidemia   . Insomnia   . Health maintenance examination   . CLL (chronic lymphoblastic leukemia)   . Heart murmur   . Hypertension   . Arthritis   . Skin cancer of upper limb or shoulder     Right shoulder cancer removal  . Skin cancer of face     nose and forehead    Past Surgical History  Procedure Date  . Hernia repair   . Right foot   . Eye surgery   . Pace maker insertion     Family History  Problem Relation Age of Onset  . Stroke Mother   . Stroke Father   .  Cancer Brother     History  Substance Use Topics  . Smoking status: Never Smoker   . Smokeless tobacco: Never Used  . Alcohol Use: Yes     Comment: occassionally      Review of Systems All other systems negative except as documented in the HPI. All pertinent positives and negatives as reviewed in the HPI.   Allergies  Aspirin; Escitalopram oxalate; and Ramipril  Home Medications   Current Outpatient Rx  Name  Route  Sig  Dispense  Refill  . ACETAMINOPHEN 325 MG PO TABS   Oral   Take 650 mg by mouth 2 (two) times daily. Pain         . AMLODIPINE BESYLATE 2.5 MG PO TABS   Oral   Take 2.5 mg by mouth daily.         . ATENOLOL 100 MG PO TABS   Oral   Take 50 mg by mouth daily.          . CHLORTHALIDONE 50 MG PO TABS   Oral   Take 50 mg by mouth daily.         Marland Kitchen VITAMIN D 1000 UNITS PO TABS   Oral   Take 2,000 Units by mouth daily.         Marland Kitchen  DEXTROMETHORPHAN-QUINIDINE 20-10 MG PO CAPS   Oral   Take 20 mg by mouth daily.         Marland Kitchen EZETIMIBE-SIMVASTATIN 10-20 MG PO TABS   Oral   Take 0.5 tablets by mouth at bedtime.         . OMEGA-3 FATTY ACIDS 1000 MG PO CAPS   Oral   Take 3 g by mouth daily.          Marland Kitchen GLUCOSAMINE-CHONDROIT-BIOFL-MN PO CAPS   Oral   Take 1 capsule by mouth 2 (two) times daily.         . IRBESARTAN 300 MG PO TABS   Oral   Take 300 mg by mouth at bedtime.         Marland Kitchen POTASSIUM CHLORIDE ER 10 MEQ PO TBCR   Oral   Take 10 mEq by mouth 2 (two) times daily.         . TESTOSTERONE CYPIONATE 100 MG/ML IM OIL   Intramuscular   Inject 400 mg into the muscle every 6 (six) weeks. For IM use only. Inject into the right gluteal.         . VITAMIN B-12 1000 MCG PO TABS   Oral   Take 1,000 mcg by mouth daily.         Marland Kitchen VITAMIN C 500 MG PO TABS   Oral   Take 1,000 mg by mouth daily.         Marland Kitchen ZALEPLON 5 MG PO CAPS   Oral   Take 5 mg by mouth at bedtime.           BP 126/64  Pulse 68  Temp 97.9 F (36.6 C)  (Oral)  Resp 16  SpO2 99%  Physical Exam  Nursing note and vitals reviewed. Constitutional: He is oriented to person, place, and time. He appears well-developed and well-nourished.  HENT:  Head: Normocephalic and atraumatic.       Laceration of about 1.5 cm above the R upper lip. The area is actively bleeding.  Eyes: EOM are normal. Pupils are equal, round, and reactive to light.  Cardiovascular: Normal rate and normal heart sounds.  Exam reveals no gallop and no friction rub.   No murmur heard. Pulmonary/Chest: Effort normal and breath sounds normal. No respiratory distress. He exhibits no tenderness.  Neurological: He is alert and oriented to person, place, and time. He exhibits normal muscle tone. Coordination normal.  Skin: Skin is warm and dry.    ED Course  Procedures (including critical care time) LACERATION REPAIR Performed by: Carlyle Dolly Authorized by: Carlyle Dolly Consent: Verbal consent obtained. Risks and benefits: risks, benefits and alternatives were discussed Consent given by: patient Patient identity confirmed: provided demographic data Prepped and Draped in normal sterile fashion Wound explored  Laceration Location: above upper lip  Laceration Length: 1.5 cm  No Foreign Bodies seen or palpated  Anesthesia: local infiltration  Local anesthetic: lidocaine 2% w/o epinephrine  Anesthetic total: 5 ml  Irrigation method: syringe Amount of cleaning: standard  Skin closure: prolene 6-0  Number of sutures: 5  Technique: simple interupted  Patient tolerance: Patient tolerated the procedure well with no immediate complications.    Patient had a mechanical fall while bent over.  Patient did not exhibit any signs of dizziness, lightheadedness, visual changes, headache, syncope, chest pain, or shortness of breath.  Patient is advised return here for any worsening in his condition.  Patient, states, that he does still work full-time job. MDM  Carlyle Dolly, PA-C 09/02/12 1417  Carlyle Dolly, PA-C 09/02/12 1418

## 2012-09-02 NOTE — ED Notes (Signed)
Patient refusing c-spine xray

## 2012-09-05 NOTE — ED Provider Notes (Signed)
Medical screening examination/treatment/procedure(s) were performed by non-physician practitioner and as supervising physician I was immediately available for consultation/collaboration.  Berta Denson T Sparkles Mcneely, MD 09/05/12 2317 

## 2012-11-20 ENCOUNTER — Encounter: Payer: Self-pay | Admitting: Neurosurgery

## 2012-11-21 ENCOUNTER — Ambulatory Visit (INDEPENDENT_AMBULATORY_CARE_PROVIDER_SITE_OTHER): Payer: Medicare PPO | Admitting: Neurosurgery

## 2012-11-21 ENCOUNTER — Other Ambulatory Visit: Payer: Self-pay | Admitting: *Deleted

## 2012-11-21 ENCOUNTER — Encounter (INDEPENDENT_AMBULATORY_CARE_PROVIDER_SITE_OTHER): Payer: Medicare PPO | Admitting: *Deleted

## 2012-11-21 ENCOUNTER — Encounter: Payer: Self-pay | Admitting: Neurosurgery

## 2012-11-21 VITALS — BP 93/43 | HR 69 | Resp 14 | Ht 72.0 in | Wt 172.0 lb

## 2012-11-21 DIAGNOSIS — I714 Abdominal aortic aneurysm, without rupture: Secondary | ICD-10-CM

## 2012-11-21 NOTE — Progress Notes (Signed)
[facility] [place] AAA/Carotid Office Note  CC: AAA surveillance Referring Physician: Early  History of Present Illness: 79 year old male patient of Dr. Arbie Cookey followed for known AAA. The patient denies any unusual abdominal or back pain. The patient denies any recent or new medical diagnoses or surgery.  Past Medical History  Diagnosis Date  . Renal disorder   . Abnormal involuntary movements   . Viral infection   . Hypogonadism male   . Fatigue   . Orthostatic lightheadedness   . Acute diarrhea   . Abdominal pain of unknown etiology   . Osteopenia   . Microalbuminuria   . Left bundle branch block   . Atrioventricular block, first degree   . Aortic insufficiency   . Abdominal aortic aneurysm   . History of benign prostatic hypertrophy   . Chronic renal disease, stage III   . Graves disease   . Hyperlipidemia   . Insomnia   . Health maintenance examination   . CLL (chronic lymphoblastic leukemia)   . Heart murmur   . Hypertension   . Arthritis   . Skin cancer of upper limb or shoulder     Right shoulder cancer removal  . Skin cancer of face     nose and forehead    ROS: [x]  Positive   [ ]  Denies    General: [ ]  Weight loss, [ ]  Fever, [ ]  chills Neurologic: [ ]  Dizziness, [ ]  Blackouts, [ ]  Seizure [ ]  Stroke, [ ]  "Mini stroke", [ ]  Slurred speech, [ ]  Temporary blindness; [ ]  weakness in arms or legs, [ ]  Hoarseness Cardiac: [ ]  Chest pain/pressure, [ ]  Shortness of breath at rest [ ]  Shortness of breath with exertion, [ ]  Atrial fibrillation or irregular heartbeat Vascular: [ ]  Pain in legs with walking, [ ]  Pain in legs at rest, [ ]  Pain in legs at night,  [ ]  Non-healing ulcer, [ ]  Blood clot in vein/DVT,   Pulmonary: [ ]  Home oxygen, [ ]  Productive cough, [ ]  Coughing up blood, [ ]  Asthma,  [ ]  Wheezing Musculoskeletal:  [ ]  Arthritis, [ ]  Low back pain, [ ]  Joint pain Hematologic: [ ]  Easy Bruising, [ ]  Anemia; [ ]   Hepatitis Gastrointestinal: [ ]  Blood in stool, [ ]  Gastroesophageal Reflux/heartburn, [ ]  Trouble swallowing Urinary: [ ]  chronic Kidney disease, [ ]  on HD - [ ]  MWF or [ ]  TTHS, [ ]  Burning with urination, [ ]  Difficulty urinating Skin: [ ]  Rashes, [ ]  Wounds Psychological: [ ]  Anxiety, [ ]  Depression   Social History History  Substance Use Topics  . Smoking status: Never Smoker   . Smokeless tobacco: Never Used  . Alcohol Use: Yes     Comment: occassionally    Family History Family History  Problem Relation Age of Onset  . Stroke Mother   . Stroke Father   . Cancer Brother   . Cancer Sister   . Cancer Brother   . Cancer Sister     Allergies  Allergen Reactions  . Aspirin     Nose bleed  . Escitalopram Oxalate   . Ramipril     Current Outpatient Prescriptions  Medication Sig Dispense Refill  . acetaminophen (TYLENOL) 325 MG tablet Take 650 mg by mouth 2 (two) times daily. Pain      . amLODipine (NORVASC) 2.5 MG tablet Take 2.5 mg by mouth daily.      Marland Kitchen atenolol (TENORMIN) 100 MG tablet Take  50 mg by mouth daily.       . carvedilol (COREG) 25 MG tablet Take 25 mg by mouth 2 (two) times daily with a meal.      . chlorthalidone (HYGROTON) 50 MG tablet Take 50 mg by mouth daily.      . cholecalciferol (VITAMIN D) 1000 UNITS tablet Take 2,000 Units by mouth daily.      Marland Kitchen escitalopram (LEXAPRO) 10 MG tablet Take 10 mg by mouth daily.      Marland Kitchen ezetimibe-simvastatin (VYTORIN) 10-20 MG per tablet Take 0.5 tablets by mouth at bedtime.      . fish oil-omega-3 fatty acids 1000 MG capsule Take 3 g by mouth daily.       . Glucosamine-Chondroit-Biofl-Mn CAPS Take 1 capsule by mouth 2 (two) times daily.      . irbesartan (AVAPRO) 300 MG tablet Take 300 mg by mouth at bedtime.      Marland Kitchen lisinopril (PRINIVIL,ZESTRIL) 10 MG tablet Take 10 mg by mouth daily.      . potassium chloride (K-DUR) 10 MEQ tablet Take 10 mEq by mouth 2 (two) times daily.      Marland Kitchen testosterone cypionate  (DEPOTESTOTERONE CYPIONATE) 100 MG/ML injection Inject 400 mg into the muscle every 6 (six) weeks. For IM use only. Inject into the right gluteal.      . vitamin B-12 (CYANOCOBALAMIN) 1000 MCG tablet Take 1,000 mcg by mouth daily.      . vitamin C (ASCORBIC ACID) 500 MG tablet Take 1,000 mg by mouth daily.      Marland Kitchen Dextromethorphan-Quinidine (NUEDEXTA) 20-10 MG CAPS Take 20 mg by mouth daily.      . zaleplon (SONATA) 5 MG capsule Take 5 mg by mouth at bedtime.        Physical Examination  Filed Vitals:   11/21/12 0923  BP: 93/43  Pulse: 69  Resp: 14    Body mass index is 23.33 kg/(m^2).  General:  WDWN in NAD Gait: Normal HEENT: WNL Eyes: Pupils equal Pulmonary: normal non-labored breathing , without Rales, rhonchi,  wheezing Cardiac: RRR, without  Murmurs, rubs or gallops; Abdomen: soft, NT, no masses Skin: no rashes, ulcers noted  Vascular Exam Pulses: 2+ radial pulses bilaterally Carotid bruits: Carotid pulses to auscultation, there is a pulsatile abdominal nontender mass palpated Extremities without ischemic changes, no Gangrene , no cellulitis; no open wounds;  Musculoskeletal: no muscle wasting or atrophy   Neurologic: A&O X 3; Appropriate Affect ; SENSATION: normal; MOTOR FUNCTION:  moving all extremities equally. Speech is fluent/normal  Non-Invasive Vascular Imaging AAA duplex shows a maximum diameter of 5.4 AP by 5.7 transverse, this was disclosed to Dr. Arbie Cookey who recommends we continue observation.  ASSESSMENT/PLAN: Asymptomatic AAA, the patient will followup in 6 months with Dr. early in a repeat AAA duplex. The patient is in agreement with this plan, his questions were encouraged and answered.  Lauree Chandler ANP   Clinic MD: Early

## 2012-12-30 ENCOUNTER — Inpatient Hospital Stay (HOSPITAL_COMMUNITY)
Admission: AD | Admit: 2012-12-30 | Discharge: 2013-01-02 | DRG: 291 | Disposition: A | Discharge status: Home / Self Care | Payer: Medicare PPO | Source: Ambulatory Visit | Attending: Internal Medicine | Admitting: Internal Medicine

## 2012-12-30 ENCOUNTER — Inpatient Hospital Stay (HOSPITAL_COMMUNITY): Payer: Medicare PPO

## 2012-12-30 ENCOUNTER — Encounter (HOSPITAL_COMMUNITY): Payer: Self-pay | Admitting: Internal Medicine

## 2012-12-30 DIAGNOSIS — E785 Hyperlipidemia, unspecified: Secondary | ICD-10-CM | POA: Diagnosis present

## 2012-12-30 DIAGNOSIS — I428 Other cardiomyopathies: Secondary | ICD-10-CM | POA: Diagnosis present

## 2012-12-30 DIAGNOSIS — IMO0001 Reserved for inherently not codable concepts without codable children: Secondary | ICD-10-CM | POA: Diagnosis present

## 2012-12-30 DIAGNOSIS — I714 Abdominal aortic aneurysm, without rupture, unspecified: Secondary | ICD-10-CM | POA: Diagnosis present

## 2012-12-30 DIAGNOSIS — I359 Nonrheumatic aortic valve disorder, unspecified: Secondary | ICD-10-CM | POA: Diagnosis present

## 2012-12-30 DIAGNOSIS — C9110 Chronic lymphocytic leukemia of B-cell type not having achieved remission: Secondary | ICD-10-CM | POA: Diagnosis present

## 2012-12-30 DIAGNOSIS — I4891 Unspecified atrial fibrillation: Secondary | ICD-10-CM | POA: Diagnosis present

## 2012-12-30 DIAGNOSIS — J189 Pneumonia, unspecified organism: Secondary | ICD-10-CM | POA: Diagnosis present

## 2012-12-30 DIAGNOSIS — Z950 Presence of cardiac pacemaker: Secondary | ICD-10-CM | POA: Diagnosis present

## 2012-12-30 DIAGNOSIS — I10 Essential (primary) hypertension: Secondary | ICD-10-CM | POA: Diagnosis present

## 2012-12-30 DIAGNOSIS — I447 Left bundle-branch block, unspecified: Secondary | ICD-10-CM | POA: Diagnosis present

## 2012-12-30 DIAGNOSIS — I129 Hypertensive chronic kidney disease with stage 1 through stage 4 chronic kidney disease, or unspecified chronic kidney disease: Secondary | ICD-10-CM | POA: Diagnosis present

## 2012-12-30 DIAGNOSIS — N183 Chronic kidney disease, stage 3 unspecified: Secondary | ICD-10-CM | POA: Diagnosis present

## 2012-12-30 DIAGNOSIS — R0600 Dyspnea, unspecified: Secondary | ICD-10-CM | POA: Diagnosis present

## 2012-12-30 DIAGNOSIS — M899 Disorder of bone, unspecified: Secondary | ICD-10-CM | POA: Diagnosis present

## 2012-12-30 DIAGNOSIS — I5043 Acute on chronic combined systolic (congestive) and diastolic (congestive) heart failure: Principal | ICD-10-CM | POA: Diagnosis present

## 2012-12-30 DIAGNOSIS — I440 Atrioventricular block, first degree: Secondary | ICD-10-CM | POA: Diagnosis present

## 2012-12-30 DIAGNOSIS — D696 Thrombocytopenia, unspecified: Secondary | ICD-10-CM | POA: Diagnosis present

## 2012-12-30 DIAGNOSIS — D72829 Elevated white blood cell count, unspecified: Secondary | ICD-10-CM | POA: Diagnosis present

## 2012-12-30 DIAGNOSIS — Z79899 Other long term (current) drug therapy: Secondary | ICD-10-CM

## 2012-12-30 DIAGNOSIS — M129 Arthropathy, unspecified: Secondary | ICD-10-CM | POA: Diagnosis present

## 2012-12-30 DIAGNOSIS — I509 Heart failure, unspecified: Secondary | ICD-10-CM | POA: Diagnosis present

## 2012-12-30 DIAGNOSIS — Z85828 Personal history of other malignant neoplasm of skin: Secondary | ICD-10-CM

## 2012-12-30 DIAGNOSIS — J90 Pleural effusion, not elsewhere classified: Secondary | ICD-10-CM | POA: Diagnosis present

## 2012-12-30 DIAGNOSIS — I351 Nonrheumatic aortic (valve) insufficiency: Secondary | ICD-10-CM | POA: Diagnosis present

## 2012-12-30 LAB — CBC
HCT: 38.3 % — ABNORMAL LOW (ref 39.0–52.0)
Hemoglobin: 13 g/dL (ref 13.0–17.0)
MCV: 108.8 fL — ABNORMAL HIGH (ref 78.0–100.0)
WBC: 16.4 10*3/uL — ABNORMAL HIGH (ref 4.0–10.5)

## 2012-12-30 LAB — COMPREHENSIVE METABOLIC PANEL
Albumin: 3.5 g/dL (ref 3.5–5.2)
Alkaline Phosphatase: 89 U/L (ref 39–117)
BUN: 42 mg/dL — ABNORMAL HIGH (ref 6–23)
Calcium: 9.4 mg/dL (ref 8.4–10.5)
GFR calc Af Amer: 47 mL/min — ABNORMAL LOW (ref >90)
Glucose, Bld: 98 mg/dL (ref 70–99)
Potassium: 5.2 mEq/L — ABNORMAL HIGH (ref 3.5–5.1)
Sodium: 140 mEq/L (ref 135–145)
Total Protein: 6.1 g/dL (ref 6.0–8.3)

## 2012-12-30 LAB — PROTIME-INR: INR: 1.22 (ref 0.00–1.49)

## 2012-12-30 LAB — TROPONIN I: Troponin I: <0.3 ng/mL (ref <0.30)

## 2012-12-30 MED ORDER — AMLODIPINE BESYLATE 2.5 MG PO TABS
2.5000 mg | ORAL_TABLET | Freq: Every day | ORAL | Status: DC
  Administered 2012-12-30 – 2013-01-02 (×3): 2.5 mg via ORAL
  Filled 2012-12-30 (×4): qty 1

## 2012-12-30 MED ORDER — AZITHROMYCIN 250 MG PO TABS
250.0000 mg | ORAL_TABLET | Freq: Every day | ORAL | Status: DC
  Administered 2012-12-31 – 2013-01-01 (×2): 250 mg via ORAL
  Filled 2012-12-30 (×3): qty 1

## 2012-12-30 MED ORDER — ACETAMINOPHEN 650 MG RE SUPP: 650.0000 mg | Freq: Four times a day (QID) | RECTAL | Status: DC | PRN

## 2012-12-30 MED ORDER — ZOLPIDEM TARTRATE 5 MG PO TABS: 5.0000 mg | ORAL_TABLET | Freq: Every evening | ORAL | Status: DC | PRN

## 2012-12-30 MED ORDER — ONDANSETRON HCL 4 MG/2ML IJ SOLN: 4.0000 mg | Freq: Four times a day (QID) | INTRAMUSCULAR | Status: DC | PRN

## 2012-12-30 MED ORDER — FUROSEMIDE 10 MG/ML IJ SOLN
60.0000 mg | Freq: Two times a day (BID) | INTRAMUSCULAR | Status: DC
  Administered 2012-12-30 – 2013-01-01 (×4): 60 mg via INTRAVENOUS
  Filled 2012-12-30 (×6): qty 6

## 2012-12-30 MED ORDER — SODIUM CHLORIDE 0.9 % IJ SOLN
3.0000 mL | Freq: Two times a day (BID) | INTRAMUSCULAR | Status: DC
  Administered 2012-12-31 – 2013-01-02 (×4): 3 mL via INTRAVENOUS

## 2012-12-30 MED ORDER — VITAMIN C 500 MG PO TABS
1000.0000 mg | ORAL_TABLET | Freq: Every day | ORAL | Status: DC
  Administered 2012-12-31 – 2013-01-02 (×3): 1000 mg via ORAL
  Filled 2012-12-30 (×3): qty 2

## 2012-12-30 MED ORDER — OMEGA-3 FATTY ACIDS 1000 MG PO CAPS: 3.0000 g | ORAL_CAPSULE | Freq: Every day | ORAL | Status: DC

## 2012-12-30 MED ORDER — VITAMIN D3 25 MCG (1000 UNIT) PO TABS
2000.0000 [IU] | ORAL_TABLET | Freq: Every day | ORAL | Status: DC
  Administered 2012-12-31 – 2013-01-02 (×3): 2000 [IU] via ORAL
  Filled 2012-12-30 (×3): qty 2

## 2012-12-30 MED ORDER — SODIUM CHLORIDE 0.9 % IJ SOLN: 3.0000 mL | INTRAMUSCULAR | Status: DC | PRN

## 2012-12-30 MED ORDER — SODIUM CHLORIDE 0.9 % IV SOLN: 250.0000 mL | INTRAVENOUS | Status: DC | PRN

## 2012-12-30 MED ORDER — ONDANSETRON HCL 4 MG PO TABS: 4.0000 mg | ORAL_TABLET | Freq: Four times a day (QID) | ORAL | Status: DC | PRN

## 2012-12-30 MED ORDER — CARVEDILOL 6.25 MG PO TABS
6.2500 mg | ORAL_TABLET | Freq: Two times a day (BID) | ORAL | Status: DC
  Administered 2012-12-30 – 2013-01-02 (×6): 6.25 mg via ORAL
  Filled 2012-12-30 (×8): qty 1

## 2012-12-30 MED ORDER — ACETAMINOPHEN 325 MG PO TABS
650.0000 mg | ORAL_TABLET | Freq: Two times a day (BID) | ORAL | Status: DC
  Administered 2012-12-30 – 2013-01-02 (×4): 650 mg via ORAL
  Filled 2012-12-30 (×6): qty 2

## 2012-12-30 MED ORDER — VITAMIN B-12 1000 MCG PO TABS
1000.0000 ug | ORAL_TABLET | Freq: Every day | ORAL | Status: DC
  Administered 2012-12-31 – 2013-01-02 (×3): 1000 ug via ORAL
  Filled 2012-12-30 (×3): qty 1

## 2012-12-30 MED ORDER — HEPARIN SODIUM (PORCINE) 5000 UNIT/ML IJ SOLN
5000.0000 [IU] | Freq: Three times a day (TID) | INTRAMUSCULAR | Status: DC
  Administered 2012-12-30 – 2013-01-01 (×5): 5000 [IU] via SUBCUTANEOUS
  Filled 2012-12-30 (×8): qty 1

## 2012-12-30 MED ORDER — ACETAMINOPHEN 325 MG PO TABS
650.0000 mg | ORAL_TABLET | Freq: Four times a day (QID) | ORAL | Status: DC | PRN
  Administered 2012-12-31 – 2013-01-01 (×2): 650 mg via ORAL

## 2012-12-30 MED ORDER — AZITHROMYCIN 500 MG PO TABS
500.0000 mg | ORAL_TABLET | Freq: Every day | ORAL | Status: AC
  Administered 2012-12-30: 500 mg via ORAL
  Filled 2012-12-30: qty 1

## 2012-12-30 MED ORDER — IRBESARTAN 150 MG PO TABS
150.0000 mg | ORAL_TABLET | Freq: Every day | ORAL | Status: DC
  Administered 2012-12-31 – 2013-01-02 (×3): 150 mg via ORAL
  Filled 2012-12-30 (×3): qty 1

## 2012-12-30 MED ORDER — SODIUM CHLORIDE 0.9 % IJ SOLN
3.0000 mL | Freq: Two times a day (BID) | INTRAMUSCULAR | Status: DC
  Administered 2012-12-30 – 2012-12-31 (×2): 3 mL via INTRAVENOUS

## 2012-12-30 MED ORDER — MORPHINE SULFATE 2 MG/ML IJ SOLN: 1.0000 mg | INTRAMUSCULAR | Status: DC | PRN

## 2012-12-30 MED ORDER — OMEGA-3-ACID ETHYL ESTERS 1 G PO CAPS
3.0000 g | ORAL_CAPSULE | Freq: Every day | ORAL | Status: DC
  Administered 2012-12-31 – 2013-01-02 (×3): 3 g via ORAL
  Filled 2012-12-30 (×3): qty 3

## 2012-12-30 MED ORDER — POTASSIUM CHLORIDE ER 10 MEQ PO TBCR
10.0000 meq | EXTENDED_RELEASE_TABLET | Freq: Two times a day (BID) | ORAL | Status: DC
  Administered 2012-12-30 – 2013-01-02 (×6): 10 meq via ORAL
  Filled 2012-12-30 (×7): qty 1

## 2012-12-30 MED ORDER — HYDROCODONE-ACETAMINOPHEN 5-325 MG PO TABS: 1.0000 | ORAL_TABLET | ORAL | Status: DC | PRN

## 2012-12-30 MED ORDER — DEXTROSE 5 % IV SOLN
1.0000 g | INTRAVENOUS | Status: DC
  Administered 2012-12-30 – 2013-01-01 (×3): 1 g via INTRAVENOUS
  Filled 2012-12-30 (×4): qty 10

## 2012-12-30 NOTE — H&P (Signed)
[facility] History and Physical  [name] BJY:[id number] DOB: 01-10-1918 DOA: 12/30/2012  Referring physician: PCP PCP: Ezequiel Kayser, MD  OP Specialists:  Cardiology: Dr. Royann Shivers  Chief Complaint: Worsening dyspnea, leg swelling and involuntary yelling sounds  HPI: Brian Underwood is a 79 y.o. male with extensive past medical and surgical history including but not limited to CLL, CKD stage-3, HTN, HL, aortic insufficiency, slow A. fib status post pacemaker, abdominal aortic aneurysm, chronic systolic and diastolic CHF, cardiomyopathy with EF 25-35% 08/15/2013 has been sent by PCP as a direct admission for evaluation and management of worsening dyspnea, leg swelling and involuntary yelling sounds. Patient indicates that he was in his usual state of health until approximately 3 weeks ago when she noticed worsening of involuntary yelling (history of chronic pseudo-bulbar affect). However over the last 3-4 days this has worsened to almost every couple of minutes, worse on lying down and better sitting up, has been associated with worsening dyspnea on exertion, orthopnea and leg swellings. Patient denies chest pains. He has nonproductive cough. No fever or chills. He has been unable to sleep in the last 2 nights. He claims compliance with his medications and sodium restricted diet. For these symptoms he went to see his PCP were chest x-ray was suggestive of left pleural effusion half way up the patient was sent directly to [name] for further evaluation and management. No current labs are available.   Revie aneurysmw of Systems: all systems reviewed and apart from history of presenting illness, are negative   Past Medical History  Diagnosis Date  . Renal disorder   . Abnormal involuntary movements   . Viral infection   . Hypogonadism male   . Fatigue   . Orthostatic lightheadedness   . Acute diarrhea   . Abdominal pain of unknown etiology   . Osteopenia   .  Microalbuminuria   . Left bundle branch block   . Atrioventricular block, first degree   . Aortic insufficiency   . Abdominal aortic aneurysm   . History of benign prostatic hypertrophy   . Chronic renal disease, stage III   . Graves disease   . Hyperlipidemia   . Insomnia   . Health maintenance examination   . CLL (chronic lymphoblastic leukemia)   . Heart murmur   . Hypertension   . Arthritis   . Skin cancer of upper limb or shoulder     Right shoulder cancer removal  . Skin cancer of face     nose and forehead   Past Surgical History  Procedure Laterality Date  . Hernia repair    . Right foot    . Eye surgery    . Pace maker insertion     Social History:  reports that he has never smoked. He has never used smokeless tobacco. He reports that  drinks alcohol. He reports that he does not use illicit drugs. patient is married and lives with his spouse. 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 Independent of activities of daily living.   Allergies  Allergen Reactions  . Aspirin     Nose bleed  . Escitalopram Oxalate   . Ramipril     Family History  Problem Relation Age of Onset  . Stroke Mother   . Stroke Father   . Cancer Brother   . Cancer Sister   . Cancer Brother   . Cancer Sister     Prior to Admission medications   Medication Sig  Start Date End Date Taking? Authorizing Provider  acetaminophen (TYLENOL) 325 MG tablet Take 650 mg by mouth 2 (two) times daily. Pain    Historical Provider, MD  amLODipine (NORVASC) 2.5 MG tablet Take 2.5 mg by mouth daily. 11/19/11 12/21/12  Minda Meo, MD  atenolol (TENORMIN) 100 MG tablet Take 50 mg by mouth daily.     Historical Provider, MD  carvedilol (COREG) 25 MG tablet Take 25 mg by mouth 2 (two) times daily with a meal.    Historical Provider, MD  chlorthalidone (HYGROTON) 50 MG tablet Take 50 mg by mouth daily.    Historical Provider, MD  cholecalciferol (VITAMIN D) 1000 UNITS tablet Take 2,000  Units by mouth daily.    Historical Provider, MD  Dextromethorphan-Quinidine (NUEDEXTA) 20-10 MG CAPS Take 20 mg by mouth daily.    Historical Provider, MD  escitalopram (LEXAPRO) 10 MG tablet Take 10 mg by mouth daily.    Historical Provider, MD  ezetimibe-simvastatin (VYTORIN) 10-20 MG per tablet Take 0.5 tablets by mouth at bedtime.    Historical Provider, MD  fish oil-omega-3 fatty acids 1000 MG capsule Take 3 g by mouth daily.     Historical Provider, MD  Glucosamine-Chondroit-Biofl-Mn CAPS Take 1 capsule by mouth 2 (two) times daily.    Historical Provider, MD  irbesartan (AVAPRO) 300 MG tablet Take 300 mg by mouth at bedtime.    Historical Provider, MD  lisinopril (PRINIVIL,ZESTRIL) 10 MG tablet Take 10 mg by mouth daily.    Historical Provider, MD  potassium chloride (K-DUR) 10 MEQ tablet Take 10 mEq by mouth 2 (two) times daily.    Historical Provider, MD  testosterone cypionate (DEPOTESTOTERONE CYPIONATE) 100 MG/ML injection Inject 400 mg into the muscle every 6 (six) weeks. For IM use only. Inject into the right gluteal.    Historical Provider, MD  vitamin B-12 (CYANOCOBALAMIN) 1000 MCG tablet Take 1,000 mcg by mouth daily.    Historical Provider, MD  vitamin C (ASCORBIC ACID) 500 MG tablet Take 1,000 mg by mouth daily.    Historical Provider, MD  zaleplon (SONATA) 5 MG capsule Take 5 mg by mouth at bedtime.    Historical Provider, MD   Physical Exam: Filed Vitals:   12/30/12 1838  BP: 136/74  Pulse: 73  Resp: 20  SpO2: 96%     General exam: Moderately built and nourished  elderly male patient,sitting up in bed without any obvious distress.  Head, eyes and ENT: Nontraumatic and normocephalic. Pupils equally reacting to light and accommodation. Oral mucosa moist.  Neck: Supple. No JVD, carotid bruit or thyromegaly.  Lymphatics: No lymphadenopathy.  Respiratory system: reduced breath sounds bilaterally especially in the bases left greater than right with a few basal crackles.  Upper lung fields are relatively clear to auscultation . No increased work of breathing.  Cardiovascular system: S1 and S2 heard, RRR. No JVD, murmurs, gallops, clicks.2+ pitting bilateral leg edema to the knees.   Gastrointestinal system: Abdomen is nondistended, soft and nontender. Normal bowel sounds heard. Patient has a pulsatile, nontender mobile mass in the left upper and mid quadrant  Central nervous system: Alert and oriented. No focal neurological deficits.  Extremities: Symmetric 5 x 5 power. Peripheral pulses difficult to appreciate secondary to edema   Skin: No rashes or acute findings.  Musculoskeletal system: Negative exam.  Psychiatry: Pleasant and cooperative.   Labs on Admission:  All labs, chest x-ray and EKG have been requested and are pending.     Assessment/Plan Principal Problem:  Dyspnea Active Problems:   AF (atrial fibrillation)   LBBB (left bundle branch block)   AAA (abdominal aortic aneurysm), 5.1 X 4.7   Chronic lymphoblastic leukemia   AI (aortic insufficiency), 2D 2003   HTN (hypertension)   Pleural effusion   Systolic and diastolic CHF, acute on chronic   CKD (chronic kidney disease), stage III   Cardiac pacemaker in situ   1. Dyspnea: Possibly secondary to acute on chronic systolic and diastolic CHF and left pleural effusion (may be transudative from CHF). Admitted to telemetry. Followup chest x-ray and outstanding labs. Cycle troponins. Daily weights and strict I and O's. IV Lasix 60 mg every 12 hourly. Hold by mouth Lasix and chlorthalidone. Continue home ARB and beta blockers. Patient had a recent echo and hence will not repeat. Monitor closely and consider cardiology consultation if he does not improve soon. Involuntary yelling sound is probably secondary to worsening dyspnea. If he does not improve with above measures, may need further workup including CT chest to look for other causes. 2. Acute on chronic systolic and diastolic CHF:  Management as above. 3. Left pleural effusion: Possibly from decompensated CHF. Patient gives no history suggestive of infective process at this time. Followup chest x-ray. He looks relatively stable and do not see an indication for urgent thoracentesis at this time. Continue Lasix and followup clinically and radiologically. 4. Atrial fibrillation/pacemaker/LBBB: Patient currently in V. paced rhythm. Continue carvedilol. Aspirin causes nosebleeds and not a anticoagulation candidate secondary to high bleeding risk ("free bleeder") and advanced age. 5. Hypertension: Controlled. Continue low dose carvedilol and Avapro. 6. Chronic kidney disease stage II-3: Last creatinine in January was 1.28. Followup repeat labs. 7. CLL/leukocytosis/thrombocytopenia: Follow CBCs.  8. Large fusiform AAA measuring upwards of 7 cm diameter by CT abdomen 06/07/2012: Asymptomatic of abdominal pain. 9. Rotated, migrated left kidney: Either this or the AAA is felt in the left side of the abdomen.     Code Status:  Full  Family Communication:  discussed with patient and a friend at  the bedside  Disposition Plan:  home when medically stable   Time spent:  70 minutes   Brian Underwood [facility] Pager [phone]  If 7PM-7AM, please contact night-coverage [email or web address] Password [facility] 12/30/2012, 7:24 PM

## 2012-12-30 NOTE — Progress Notes (Signed)
ANTIBIOTIC CONSULT NOTE - INITIAL  Pharmacy Consult for Rocephin Indication: pneumonia  Allergies  Allergen Reactions  . Aspirin     Nose bleed  . Escitalopram Oxalate   . Ramipril     Vital Signs: Temp: 98.2 F (36.8 C) (03/10 2038) Temp src: Oral (03/10 2038) BP: 136/68 mmHg (03/10 2038) Pulse Rate: 70 (03/10 2038) Intake/Output from previous day:   Intake/Output from this shift:    Labs:  Recent Labs  12/30/12 1937  WBC 16.4*  HGB 13.0  PLT 88*  CREATININE 1.44*   The CrCl is unknown because both a height and weight (above a minimum accepted value) are required for this calculation. No results found for this basename: VANCOTROUGH, VANCOPEAK, VANCORANDOM, GENTTROUGH, GENTPEAK, GENTRANDOM, TOBRATROUGH, TOBRAPEAK, TOBRARND, AMIKACINPEAK, AMIKACINTROU, AMIKACIN,  in the last 72 hours   Microbiology: No results found for this or any previous visit (from the past 720 hour(s)).  Medical History: Past Medical History  Diagnosis Date  . Renal disorder   . Abnormal involuntary movements   . Viral infection   . Hypogonadism male   . Fatigue   . Orthostatic lightheadedness   . Acute diarrhea   . Abdominal pain of unknown etiology   . Osteopenia   . Microalbuminuria   . Left bundle branch block   . Atrioventricular block, first degree   . Aortic insufficiency   . Abdominal aortic aneurysm   . History of benign prostatic hypertrophy   . Chronic renal disease, stage III   . Graves disease   . Hyperlipidemia   . Insomnia   . Health maintenance examination   . CLL (chronic lymphoblastic leukemia)   . Heart murmur   . Hypertension   . Arthritis   . Skin cancer of upper limb or shoulder     Right shoulder cancer removal  . Skin cancer of face     nose and forehead    Assessment: 31 YOM with extensive PMH, presented with worsening dyspnea. Chest X-ray suggestive left pleural effusion, wbc is elevated at 16.4. Pharmacy is consulted to start rocephin for  suspected pneumonia. Patient is also started on azithromycin PO x 5 days   Plan:  - Rocephin 1g IV Q 24hrs - f/u clinical course and LOT  Bayard Hugger, PharmD, BCPS  Clinical Pharmacist  Pager: [phone]   12/30/2012,10:44 PM

## 2012-12-30 NOTE — Progress Notes (Signed)
Notified MD that patient is in room 2022 from doctors office. Awaiting orders. Brian Underwood

## 2012-12-30 NOTE — Plan of Care (Signed)
Name: Brian Underwood MRN: [id number] PCP: Ezequiel Kayser, MD   HPI: Patient of Dr. 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  Presents with shortness of breath, lower extremity swelling and pleural effusions to his primary care physician's office.  Patient has chronic kidney disease with a baseline creatinine of 1.7.  Chronic systolic congestive heart failure with EF of 45-50% based on a 2-D echocardiogram in January of 2013, mild to moderate aortic regurgitation.  Per Dr. Sherrye Payor patient has a chronic pseudobulbar affect for many years which causes him to yell out whenever he lays down, has tried him on some medications with minimal response.   Vitals: Afebrile, 81, 120/86, normal respirations with 95% on room air  Bed request: Telemetry, [facility] 10.  REDDY,SRIKAR A, MD 12/30/2012, 5:28 PM

## 2012-12-31 ENCOUNTER — Inpatient Hospital Stay (HOSPITAL_COMMUNITY): Payer: Medicare PPO

## 2012-12-31 DIAGNOSIS — M7989 Other specified soft tissue disorders: Secondary | ICD-10-CM

## 2012-12-31 LAB — BASIC METABOLIC PANEL
GFR calc Af Amer: 41 mL/min — ABNORMAL LOW (ref >90)
GFR calc non Af Amer: 36 mL/min — ABNORMAL LOW (ref >90)
Potassium: 4.4 mEq/L (ref 3.5–5.1)
Sodium: 143 mEq/L (ref 135–145)

## 2012-12-31 LAB — CBC
Hemoglobin: 12.4 g/dL — ABNORMAL LOW (ref 13.0–17.0)
MCHC: 33.3 g/dL (ref 30.0–36.0)
RBC: 3.34 MIL/uL — ABNORMAL LOW (ref 4.22–5.81)

## 2012-12-31 LAB — FERRITIN: Ferritin: 70 ng/mL (ref 22–322)

## 2012-12-31 LAB — RETICULOCYTES: Retic Count, Absolute: 53.2 10*3/uL (ref 19.0–186.0)

## 2012-12-31 LAB — FOLATE: Folate: 16.3 ng/mL

## 2012-12-31 LAB — IRON AND TIBC
Iron: 82 ug/dL (ref 42–135)
TIBC: 266 ug/dL (ref 215–435)

## 2012-12-31 LAB — TROPONIN I
Troponin I: <0.3 ng/mL (ref <0.30)
Troponin I: <0.3 ng/mL (ref <0.30)

## 2012-12-31 MED ORDER — PANTOPRAZOLE SODIUM 40 MG PO TBEC
40.0000 mg | DELAYED_RELEASE_TABLET | Freq: Every day | ORAL | Status: DC
  Administered 2012-12-31 – 2013-01-02 (×3): 40 mg via ORAL
  Filled 2012-12-31 (×3): qty 1

## 2012-12-31 MED ORDER — ENSURE COMPLETE PO LIQD
237.0000 mL | Freq: Every day | ORAL | Status: DC
  Administered 2012-12-31 – 2013-01-01 (×2): 237 mL via ORAL

## 2012-12-31 MED ORDER — IRBESARTAN 150 MG PO TABS: 150.0000 mg | ORAL_TABLET | Freq: Every day | ORAL | Status: DC

## 2012-12-31 MED ORDER — CARVEDILOL 6.25 MG PO TABS: 6.2500 mg | ORAL_TABLET | Freq: Two times a day (BID) | ORAL | Status: DC

## 2012-12-31 MED ORDER — SIMETHICONE 80 MG PO CHEW
160.0000 mg | CHEWABLE_TABLET | Freq: Four times a day (QID) | ORAL | Status: DC | PRN
  Administered 2012-12-31: 160 mg via ORAL
  Filled 2012-12-31 (×2): qty 2

## 2012-12-31 MED ORDER — PANTOPRAZOLE SODIUM 40 MG PO TBEC: 40.0000 mg | DELAYED_RELEASE_TABLET | Freq: Four times a day (QID) | ORAL | Status: DC | PRN

## 2012-12-31 MED ORDER — HYDRALAZINE HCL 20 MG/ML IJ SOLN
INTRAMUSCULAR | Status: AC
  Filled 2012-12-31: qty 1

## 2012-12-31 NOTE — Evaluation (Signed)
Clinical/Bedside Swallow Evaluation Patient Details  Name: Brian Underwood MRN: [id number] Date of Birth: 29-Aug-1918  Today's Date: 12/31/2012 Time: 1415-1430 SLP Time Calculation (min): 15 min  Past Medical History:  Past Medical History  Diagnosis Date  . Renal disorder   . Abnormal involuntary movements   . Viral infection   . Hypogonadism male   . Fatigue   . Orthostatic lightheadedness   . Acute diarrhea   . Abdominal pain of unknown etiology   . Osteopenia   . Microalbuminuria   . Left bundle branch block   . Atrioventricular block, first degree   . Aortic insufficiency   . Abdominal aortic aneurysm   . History of benign prostatic hypertrophy   . Chronic renal disease, stage III   . Graves disease   . Hyperlipidemia   . Insomnia   . Health maintenance examination   . CLL (chronic lymphoblastic leukemia)   . Heart murmur   . Hypertension   . Arthritis   . Skin cancer of upper limb or shoulder     Right shoulder cancer removal  . Skin cancer of face     nose and forehead   Past Surgical History:  Past Surgical History  Procedure Laterality Date  . Hernia repair    . Right foot    . Eye surgery    . Pace maker insertion     HPI:  Brian Underwood is a 79 y.o. male with extensive past medical and surgical history including but not limited to CLL, CKD stage-3, HTN, HL, aortic insufficiency, slow A. fib status post pacemaker, abdominal aortic aneurysm, chronic systolic and diastolic CHF, cardiomyopathy. Patient indicates that he was in his usual state of health until approximately 3 weeks ago when he noticed worsening of involuntary yelling (history of chronic pseudo-bulbar affect). Over the last 3-4 days this has worsened to almost every couple of minutes, worse on lying down and better sitting up, has been associated with worsening dyspnea on exertion, orthopnea and leg swellings. Patient denies chest pains. He has nonproductive cough. No fever or chills. For these  symptoms he went to see his PCP where chest x-ray was suggestive of left pleural effusion.   Assessment / Plan / Recommendation Clinical Impression  Patient presents with a functional swallow with no overt s/s of aspiration. Patient observed with po's at bedside and reports to SLP that he has no difficulty swallowing or consuming po's. Oral motor exam was WNL. SLP inquired about any additional questions/concerns and pt reports he eats at a slower rate because of increased SOB during meals, SLP recommends continuation of thi strategy to decrease risk of aspiration. MD, based on PNA, if feel necessary to rule out silent aspiration, MBS could be considered. Otherwise, pt appears safe to contninue reg diet with no SLP f/u.    Aspiration Risk  Mild    Diet Recommendation Regular;Thin liquid   Liquid Administration via: Cup;Straw Medication Administration: Whole meds with liquid Supervision: Patient able to self feed Compensations: Slow rate;Small sips/bites Postural Changes and/or Swallow Maneuvers: Seated upright 90 degrees;Upright 30-60 min after meal    Other  Recommendations Oral Care Recommendations: Oral care BID   Follow Up Recommendations  None    Frequency and Duration        Pertinent Vitals/Pain None reported    SLP Swallow Goals     Swallow Study Prior Functional Status  Type of Home: House Lives With: Spouse Vocation: Part time employment    General Date  of Onset: 12/30/12 HPI: Brian Underwood is a 79 y.o. male with extensive past medical and surgical history including but not limited to CLL, CKD stage-3, HTN, HL, aortic insufficiency, slow A. fib status post pacemaker, abdominal aortic aneurysm, chronic systolic and diastolic CHF, cardiomyopathy. Patient indicates that he was in his usual state of health until approximately 3 weeks ago when he noticed worsening of involuntary yelling (history of chronic pseudo-bulbar affect). Over the last 3-4 days this has worsened to  almost every couple of minutes, worse on lying down and better sitting up, has been associated with worsening dyspnea on exertion, orthopnea and leg swellings. Patient denies chest pains. He has nonproductive cough. No fever or chills. For these symptoms he went to see his PCP where chest x-ray was suggestive of left pleural effusion. Type of Study: Bedside swallow evaluation Previous Swallow Assessment: none found Diet Prior to this Study: Regular;Thin liquids (diet heart) Temperature Spikes Noted: No Respiratory Status: Room air History of Recent Intubation: No Behavior/Cognition: Alert;Cooperative;Pleasant mood Oral Cavity - Dentition: Adequate natural dentition Self-Feeding Abilities: Able to feed self Patient Positioning: Upright in bed Baseline Vocal Quality: Clear Volitional Cough: Strong Volitional Swallow: Able to elicit    Oral/Motor/Sensory Function Overall Oral Motor/Sensory Function: Appears within functional limits for tasks assessed Labial ROM: Within Functional Limits Labial Symmetry: Within Functional Limits Labial Strength: Within Functional Limits Labial Sensation: Within Functional Limits Lingual ROM: Within Functional Limits Lingual Symmetry: Within Functional Limits Lingual Strength: Within Functional Limits Lingual Sensation: Within Functional Limits Facial ROM: Within Functional Limits Facial Symmetry: Within Functional Limits Facial Strength: Within Functional Limits Facial Sensation: Within Functional Limits Velum: Within Functional Limits Mandible: Within Functional Limits   Ice Chips Ice chips: Not tested   Thin Liquid Thin Liquid: Within functional limits Presentation: Cup;Straw;Self Fed    Nectar Thick Nectar Thick Liquid: Not tested   Honey Thick Honey Thick Liquid: Not tested   Puree Puree: Within functional limits Presentation: Self Fed;Spoon   Solid   GO    Solid: Within functional limits Presentation: Self Fed      Berdine Dance SLP  student Berdine Dance 12/31/2012,3:00 PM

## 2012-12-31 NOTE — Evaluation (Signed)
Physical Therapy Evaluation Patient Details Name: Brian Underwood MRN: [id number] DOB: 09-17-1918 Today's Date: 12/31/2012 Time: 4034-7425 PT Time Calculation (min): 18 min  PT Assessment / Plan / Recommendation Clinical Impression  Pt admitted with dyspnea, LE edema, Afib and uncontrolled yelling. Pt currently moving very well and only minor dyspnea of sats 89-95% on RA with 300' ambulation. Pt near baseline and anticipate may only need one more session to reinforce breathing technique with ambulation and assess O2 sats with stair ambulation. Pt very pleasant and stated he feels bad for taking a day off work on his bday to be in the hospital. Will follow acutely to maximize mobiltiy and O2 sats with activity prior to return home.     PT Assessment  Patient needs continued PT services    Follow Up Recommendations  No PT follow up    Does the patient have the potential to tolerate intense rehabilitation      Barriers to Discharge None      Equipment Recommendations  None recommended by PT    Recommendations for Other Services     Frequency Min 3X/week    Precautions / Restrictions Precautions Precautions: Fall   Pertinent Vitals/Pain No pain      Mobility  Bed Mobility Bed Mobility: Supine to Sit Supine to Sit: 6: Modified independent (Device/Increase time);HOB elevated Details for Bed Mobility Assistance: HOB elevated 30 degrees due to pt stating he is SOb with lying flat Transfers Transfers: Sit to Stand;Stand to Sit Sit to Stand: 6: Modified independent (Device/Increase time);From bed Stand to Sit: 6: Modified independent (Device/Increase time);To chair/3-in-1 Ambulation/Gait Ambulation/Gait Assistance: 5: Supervision Ambulation Distance (Feet): 300 Feet Assistive device: 1 person hand held assist Ambulation/Gait Assistance Details: pt typically uses a cane in Right hand and able to ambulate with very minimal assist of one hand held assist Gait Pattern: Step-through  pattern;Decreased stride length;Trunk flexed Gait velocity: decreased Stairs: No    Exercises     PT Diagnosis: Difficulty walking  PT Problem List: Decreased activity tolerance PT Treatment Interventions: Gait training;Stair training;Patient/family education   PT Goals Acute Rehab PT Goals PT Goal Formulation: With patient Time For Goal Achievement: 01/07/13 Potential to Achieve Goals: Good Pt will go Supine/Side to Sit: with modified independence;with HOB 0 degrees PT Goal: Supine/Side to Sit - Progress: Goal set today Pt will Ambulate: >150 feet;with modified independence;with least restrictive assistive device, with sats >92% on RA PT Goal: Ambulate - Progress: Goal set today Pt will Go Up / Down Stairs: Flight;with modified independence;with rail(s) PT Goal: Up/Down Stairs - Progress: Goal set today  Visit Information  Last PT Received On: 12/31/12 Assistance Needed: +1    Subjective Data  Subjective: I feel like I'm doing ok. Patient Stated Goal: return home   Prior Functioning  Home Living Lives With: Spouse Type of Home: House Home Access: Stairs to enter Entrance Stairs-Number of Steps: 1 Home Layout: One level Bathroom Shower/Tub: Walk-in shower;Door Bathroom Toilet: Handicapped height Home Adaptive Equipment: Straight cane;Shower chair with back;Grab bars around toilet;Grab bars in shower;Wheelchair - manual Prior Function Level of Independence: Independent Able to Take Stairs?: Yes Driving: Yes Vocation: Part time employment Comments: Pt does the housework and cooking and all his own ADLs. Pt reports a fall 2 months ago. 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 Communication Communication: HOH    Cognition  Cognition Overall Cognitive Status: Appears within functional limits for tasks assessed/performed Arousal/Alertness: Awake/alert Orientation  Level: Appears intact for tasks assessed Behavior During  Session: [facility] for tasks performed    Extremity/Trunk Assessment Right Upper Extremity Assessment RUE ROM/Strength/Tone: [facility] for tasks assessed Left Upper Extremity Assessment LUE ROM/Strength/Tone: [facility] for tasks assessed Right Lower Extremity Assessment RLE ROM/Strength/Tone: [facility] for tasks assessed Left Lower Extremity Assessment LLE ROM/Strength/Tone: [facility] for tasks assessed Trunk Assessment Trunk Assessment: Kyphotic   Balance    End of Session PT - End of Session Activity Tolerance: Patient tolerated treatment well Patient left: in chair;with call bell/phone within reach  GP     Delorse Lek 12/31/2012, 2:01 PM Delaney Meigs, PT [phone]

## 2012-12-31 NOTE — Progress Notes (Signed)
[facility] PROGRESS NOTE  Brian Underwood YNW:[id number] DOB: [birth year] DOA: 12/30/2012 PCP: Ezequiel Kayser, MD  Assessment/Plan: Principal Problem:   Dyspnea Active Problems:   AF (atrial fibrillation)   LBBB (left bundle branch block)   AAA (abdominal aortic aneurysm), 5.1 X 4.7   Chronic lymphoblastic leukemia   AI (aortic insufficiency), 2D 2003   HTN (hypertension)   Pleural effusion   Systolic and diastolic CHF, acute on chronic   CKD (chronic kidney disease), stage III   Cardiac pacemaker in situ    1. Dyspnea: Possibly secondary to acute on chronic systolic and diastolic CHF and left pleural effusion (may be transudative from CHF), possible PNA . Admitted to telemetry. Followup chest x-ray and outstanding labs. Cycle troponins. Daily weights and strict I and O's. IV Lasix 60 mg every 12 hourly. Hold by mouth Lasix and chlorthalidone. Continue home ARB and beta blockers. Patient had a recent echo and hence will not repeat. EF of 45-50% Monitor closely and consider cardiology consultation if he does not improve soon. Involuntary yelling sound is probably secondary to worsening dyspnea. If he does not improve with above measures, may need further workup including CT chest to look for other causes.Continue abx, will obtain speech therapy consult  2. Acute on chronic systolic and diastolic CHF: Management as above. 3. Left pleural effusion: Possibly from decompensated CHF. Patient gives no history suggestive of infective process at this time. Followup chest x-ray. He looks relatively stable and do not see an indication for urgent thoracentesis at this time. Continue Lasix and followup clinically and radiologically. 4. Atrial fibrillation/pacemaker/LBBB: Patient currently in V. paced rhythm. Continue carvedilol. Aspirin causes nosebleeds and not a anticoagulation candidate secondary to high bleeding risk ("free bleeder") and advanced age. 5. Hypertension: Controlled. Continue low  dose carvedilol and Avapro. 6. Chronic kidney disease stage II-3: Last creatinine in January was 1.28. Followup repeat labs. 7. CLL/leukocytosis/thrombocytopenia: Follow CBCs.  8. Large fusiform AAA measuring upwards of 7 cm diameter by CT abdomen 06/07/2012: Asymptomatic of abdominal pain. 9. Rotated, migrated left kidney: Either this or the AAA is felt in the left side of the abdomen.  10. Macrocytic anemia we'll order an anemia panel  Code Status: full Family Communication: family updated about patient's clinical progress Disposition Plan:  As above    Brief narrative: 79 y.o. male with extensive past medical and surgical history including but not limited to CLL, CKD stage-3, HTN, HL, aortic insufficiency, slow A. fib status post pacemaker, abdominal aortic aneurysm, chronic systolic and diastolic CHF, cardiomyopathy with EF 25-35% 08/15/2013 has been sent by PCP as a direct admission for evaluation and management of worsening dyspnea, leg swelling and involuntary yelling sounds. Patient indicates that he was in his usual state of health until approximately 3 weeks ago when she noticed worsening of involuntary yelling (history of chronic pseudo-bulbar affect). However over the last 3-4 days this has worsened to almost every couple of minutes, worse on lying down and better sitting up, has been associated with worsening dyspnea on exertion, orthopnea and leg swellings. Patient denies chest pains. He has nonproductive cough. No fever or chills. He has been unable to sleep in the last 2 nights. He claims compliance with his medications and sodium restricted diet. For these symptoms he went to see his PCP were chest x-ray was suggestive of left pleural effusion half way up the patient was sent directly to [facility] for further evaluation and management. No current labs are available.  Consultants:  None  Procedures:  None  Antibiotics: Assessment/azithromycin started  3/10  HPI/Subjective: Dyspnea improved*  Objective: Filed Vitals:   12/30/12 2315 12/31/12 0532 12/31/12 0645 12/31/12 0650  BP:  111/54    Pulse:  69    Temp:  97.5 F (36.4 C)    TempSrc:  Oral    Resp:  18    Height: 6\' 2"  (1.88 m)     Weight:   75.66 kg (166 lb 12.8 oz)   SpO2:  90%  96%    Intake/Output Summary (Last 24 hours) at 12/31/12 0855 Last data filed at 12/31/12 0700  Gross per 24 hour  Intake    360 ml  Output   2025 ml  Net  -1665 ml    Exam: General exam: Moderately built and nourished elderly male patient,sitting up in bed without any obvious distress.  Head, eyes and ENT: Nontraumatic and normocephalic. Pupils equally reacting to light and accommodation. Oral mucosa moist.  Neck: Supple. No JVD, carotid bruit or thyromegaly.  Lymphatics: No lymphadenopathy.  Respiratory system: reduced breath sounds bilaterally especially in the bases left greater than right with a few basal crackles. Upper lung fields are relatively clear to auscultation . No increased work of breathing.  Cardiovascular system: S1 and S2 heard, RRR. No JVD, murmurs, gallops, clicks.2+ pitting bilateral leg edema to the knees.  Gastrointestinal system: Abdomen is nondistended, soft and nontender. Normal bowel sounds heard. Patient has a pulsatile, nontender mobile mass in the left upper and mid quadrant  Central nervous system: Alert and oriented. No focal neurological deficits.  Extremities: Symmetric 5 x 5 power. Peripheral pulses difficult to appreciate secondary to edema  Skin: No rashes or acute findings.  Musculoskeletal system: Negative exam.  Psychiatry: Pleasant and cooperative.   Data Reviewed: Basic Metabolic Panel:  Recent Labs Lab 12/30/12 1937 12/31/12 0727  NA 140 143  K 5.2* 4.4  CL 106 106  CO2 25 30  GLUCOSE 98 79  BUN 42* 43*  CREATININE 1.44* 1.59*  CALCIUM 9.4 9.2    Liver Function Tests:  Recent Labs Lab 12/30/12 1937  AST 19  ALT 12  ALKPHOS  89  BILITOT 0.5  PROT 6.1  ALBUMIN 3.5   No results found for this basename: LIPASE, AMYLASE,  in the last 168 hours No results found for this basename: AMMONIA,  in the last 168 hours  CBC:  Recent Labs Lab 12/30/12 1937 12/31/12 0727  WBC 16.4* 15.7*  HGB 13.0 12.4*  HCT 38.3* 37.2*  MCV 108.8* 111.4*  PLT 88* 85*    Cardiac Enzymes:  Recent Labs Lab 12/30/12 1924 12/31/12 0054 12/31/12 0727  TROPONINI <0.30 <0.30 <0.30   BNP (last 3 results) No results found for this basename: PROBNP,  in the last 8760 hours   CBG: No results found for this basename: GLUCAP,  in the last 168 hours  No results found for this or any previous visit (from the past 240 hour(s)).   Studies: Dg Chest 2 View  12/30/2012  *RADIOLOGY REPORT*  Clinical Data: Dyspnea  CHEST - 2 VIEW  Comparison: 11/18/2011  Findings: Left chest wall dual lead pacemaker is in stable position with leads terminating in the right atrium right ventricle. The heart appears enlarged.  The left heart border is obscured by the left basilar opacity. There is a prominent left basilar opacity and small to moderate left pleural effusion.  There may be a tiny right pleural effusion.  Chronic peribronchial thickening  noted.  No acute osseous abnormality.  IMPRESSION: Left basilar opacity with associated small to moderate left pleural effusion.  Findings are suspicious for pneumonia with a parapneumonic effusion.  Radiographic follow-up to clearing is recommended.   Original Report Authenticated By: Britta Mccreedy, M.D.     Scheduled Meds: . acetaminophen  650 mg Oral BID  . amLODipine  2.5 mg Oral Daily  . azithromycin  250 mg Oral Q supper  . carvedilol  6.25 mg Oral BID WC  . cefTRIAXone (ROCEPHIN)  IV  1 g Intravenous Q24H  . cholecalciferol  2,000 Units Oral Daily  . furosemide  60 mg Intravenous Q12H  . heparin  5,000 Units Subcutaneous Q8H  . irbesartan  150 mg Oral Daily  . omega-3 acid ethyl esters  3 g Oral Daily   . potassium chloride  10 mEq Oral BID  . sodium chloride  3 mL Intravenous Q12H  . sodium chloride  3 mL Intravenous Q12H  . vitamin B-12  1,000 mcg Oral Daily  . vitamin C  1,000 mg Oral Daily   Continuous Infusions:   Principal Problem:   Dyspnea Active Problems:   AF (atrial fibrillation)   LBBB (left bundle branch block)   AAA (abdominal aortic aneurysm), 5.1 X 4.7   Chronic lymphoblastic leukemia   AI (aortic insufficiency), 2D 2003   HTN (hypertension)   Pleural effusion   Systolic and diastolic CHF, acute on chronic   CKD (chronic kidney disease), stage III   Cardiac pacemaker in situ    Time spent: 40 minutes   [facility]  [facility] Pager [phone]. If 8PM-8AM, please contact night-coverage at [email or web address], password [facility] 12/31/2012, 8:55 AM  LOS: 1 day

## 2012-12-31 NOTE — Progress Notes (Addendum)
INITIAL NUTRITION ASSESSMENT  DOCUMENTATION CODES Per approved criteria  -Not Applicable   INTERVENTION:  Ensure Complete daily (350 kcals, 13 gm protein per 8 fl oz bottle) RD to follow for nutrition care plan  NUTRITION DIAGNOSIS: Unintended weight loss related to abdominal pain, decreased appetite as evidenced by 13% weight loss  Goal: Oral intake with meals & supplements to meet >/= 90% of estimated nutrition needs  Monitor:  PO & supplemental intake, weight, labs, I/O's  Reason for Assessment: Malnutrition Screening Tool Report  79 y.o. male  Admitting Dx: Dyspnea  ASSESSMENT: Patient presented with worsening dyspnea; chest X-ray suggestive left pleural effusion ---> sent directly to [facility] for further evaluation and management.  Patient reports his appetite is "a little off;" PO intake 100% this AM per flowsheet record; states he's lost approximately 20 lbs; per weight records, he's lost 26 lbs since October 2013 (13%) which is significant for time frame; GI office visit records reviewed ---> patient was experiencing abdominal pain & decreased appetite; would like an Ensure Complete supplement daily ---> RD to order.  Height: 5\' 11"  (1.803 m)  Weight: Wt Readings from Last 1 Encounters:  12/31/12 166 lb 12.8 oz (75.66 kg)    Ideal Body Weight: 172 lb  % Ideal Body Weight: 96%  Wt Readings from Last 10 Encounters:  12/31/12 166 lb 12.8 oz (75.66 kg)  11/21/12 172 lb (78.019 kg)  07/31/12 192 lb 12.8 oz (87.454 kg)  06/18/12 185 lb 8 oz (84.142 kg)  05/21/12 182 lb (82.555 kg)  03/05/12 179 lb 9.6 oz (81.466 kg)  11/23/11 189 lb (85.73 kg)  11/19/11 189 lb 13.1 oz (86.1 kg)  11/19/11 189 lb 13.1 oz (86.1 kg)    Usual Body Weight: 186 lb  % Usual Body Weight: 89%  BMI:  23.3 kg/m2  Estimated Nutritional Needs: Kcal: 1600-1800 Protein: 75-85 gm Fluid: 1.6-1.8 L  Skin: Intact  Diet Order: Cardiac  EDUCATION NEEDS: -No education needs identified at  this time   Intake/Output Summary (Last 24 hours) at 12/31/12 1126 Last data filed at 12/31/12 0700  Gross per 24 hour  Intake    360 ml  Output   2025 ml  Net  -1665 ml    Last BM: 3/11  Labs:   Recent Labs Lab 12/30/12 1937 12/31/12 0727  NA 140 143  K 5.2* 4.4  CL 106 106  CO2 25 30  BUN 42* 43*  CREATININE 1.44* 1.59*  CALCIUM 9.4 9.2  GLUCOSE 98 79    Scheduled Meds: . acetaminophen  650 mg Oral BID  . amLODipine  2.5 mg Oral Daily  . azithromycin  250 mg Oral Q supper  . carvedilol  6.25 mg Oral BID WC  . cefTRIAXone (ROCEPHIN)  IV  1 g Intravenous Q24H  . cholecalciferol  2,000 Units Oral Daily  . furosemide  60 mg Intravenous Q12H  . heparin  5,000 Units Subcutaneous Q8H  . irbesartan  150 mg Oral Daily  . omega-3 acid ethyl esters  3 g Oral Daily  . potassium chloride  10 mEq Oral BID  . sodium chloride  3 mL Intravenous Q12H  . sodium chloride  3 mL Intravenous Q12H  . vitamin B-12  1,000 mcg Oral Daily  . vitamin C  1,000 mg Oral Daily    Continuous Infusions:   Past Medical History  Diagnosis Date  . Renal disorder   . Abnormal involuntary movements   . Viral infection   . Hypogonadism male   .  Fatigue   . Orthostatic lightheadedness   . Acute diarrhea   . Abdominal pain of unknown etiology   . Osteopenia   . Microalbuminuria   . Left bundle branch block   . Atrioventricular block, first degree   . Aortic insufficiency   . Abdominal aortic aneurysm   . History of benign prostatic hypertrophy   . Chronic renal disease, stage III   . Graves disease   . Hyperlipidemia   . Insomnia   . Health maintenance examination   . CLL (chronic lymphoblastic leukemia)   . Heart murmur   . Hypertension   . Arthritis   . Skin cancer of upper limb or shoulder     Right shoulder cancer removal  . Skin cancer of face     nose and forehead    Past Surgical History  Procedure Laterality Date  . Hernia repair    . Right foot    . Eye surgery     . Pace maker insertion      Maureen Chatters, [place], LDN Pager #: [phone] After-Hours Pager #: [phone]

## 2012-12-31 NOTE — Progress Notes (Signed)
ANTIBIOTIC CONSULT NOTE - F/U consult  Pharmacy Consult for Rocephin Indication: pneumonia  Allergies  Allergen Reactions  . Aspirin     Nose bleed  . Escitalopram Oxalate   . Ramipril     Vital Signs: Temp: 97.5 F (36.4 C) (03/11 0532) Temp src: Oral (03/11 0532) BP: 111/54 mmHg (03/11 0532) Pulse Rate: 69 (03/11 0532) Intake/Output from previous day: 03/10 0701 - 03/11 0700 In: 360 [P.O.:360] Out: 2025 [Urine:2025] Intake/Output from this shift:    Labs:  Recent Labs  12/30/12 1937 12/31/12 0727  WBC 16.4* 15.7*  HGB 13.0 12.4*  PLT 88* 85*  CREATININE 1.44* 1.59*   Estimated Creatinine Clearance: 31.1 ml/min (by C-G formula based on Cr of 1.59). No results found for this basename: VANCOTROUGH, VANCOPEAK, VANCORANDOM, GENTTROUGH, GENTPEAK, GENTRANDOM, TOBRATROUGH, TOBRAPEAK, TOBRARND, AMIKACINPEAK, AMIKACINTROU, AMIKACIN,  in the last 72 hours   Microbiology: No results found for this or any previous visit (from the past 720 hour(s)).  Medical History: Past Medical History  Diagnosis Date  . Renal disorder   . Abnormal involuntary movements   . Viral infection   . Hypogonadism male   . Fatigue   . Orthostatic lightheadedness   . Acute diarrhea   . Abdominal pain of unknown etiology   . Osteopenia   . Microalbuminuria   . Left bundle branch block   . Atrioventricular block, first degree   . Aortic insufficiency   . Abdominal aortic aneurysm   . History of benign prostatic hypertrophy   . Chronic renal disease, stage III   . Graves disease   . Hyperlipidemia   . Insomnia   . Health maintenance examination   . CLL (chronic lymphoblastic leukemia)   . Heart murmur   . Hypertension   . Arthritis   . Skin cancer of upper limb or shoulder     Right shoulder cancer removal  . Skin cancer of face     nose and forehead    Assessment: 78 YOM with extensive PMH, presented with worsening dyspnea. Chest X-ray suggestive left pleural effusion, wbc is  elevated at 16.4 slightly down to 15.7. Rocephin for suspected pneumonia. Patient is also started on azithromycin PO x 5 days   Plan:  Rocphin 1g IV q24h dose ok. No renal adjustment required. Pharmacy will sign off.   12/31/2012,10:18 AM

## 2012-12-31 NOTE — Progress Notes (Signed)
Utilization Review Completed.Dowell, Deborah T3/08/2013  

## 2012-12-31 NOTE — Progress Notes (Signed)
*  Preliminary Results* Bilateral lower extremity venous duplex completed. Bilateral lower extremities are negative for deep vein thrombosis. No evidence of Baker's cyst.   12/31/2012 2:16 PM Gertie Fey, RDMS, RDCS

## 2013-01-01 LAB — COMPREHENSIVE METABOLIC PANEL
ALT: 9 U/L (ref 0–53)
AST: 16 U/L (ref 0–37)
Alkaline Phosphatase: 81 U/L (ref 39–117)
CO2: 27 mEq/L (ref 19–32)
Calcium: 8.9 mg/dL (ref 8.4–10.5)
Chloride: 103 mEq/L (ref 96–112)
GFR calc Af Amer: 41 mL/min — ABNORMAL LOW (ref >90)
GFR calc non Af Amer: 35 mL/min — ABNORMAL LOW (ref >90)
Glucose, Bld: 95 mg/dL (ref 70–99)
Sodium: 142 mEq/L (ref 135–145)
Total Bilirubin: 0.6 mg/dL (ref 0.3–1.2)

## 2013-01-01 LAB — CBC
MCH: 36.9 pg — ABNORMAL HIGH (ref 26.0–34.0)
MCHC: 34.1 g/dL (ref 30.0–36.0)
Platelets: 84 10*3/uL — ABNORMAL LOW (ref 150–400)
RDW: 15.5 % (ref 11.5–15.5)

## 2013-01-01 MED ORDER — FUROSEMIDE 40 MG PO TABS
40.0000 mg | ORAL_TABLET | Freq: Two times a day (BID) | ORAL | Status: DC
  Administered 2013-01-01 – 2013-01-02 (×2): 40 mg via ORAL
  Filled 2013-01-01 (×4): qty 1

## 2013-01-01 NOTE — Evaluation (Signed)
Occupational Therapy Evaluation Patient Details Name: Brian Underwood MRN: [id number] DOB: 1918/05/23 Today's Date: 01/01/2013 Time: 4098-1191 OT Time Calculation (min): 20 min  OT Assessment / Plan / Recommendation Clinical Impression  Pleasant 79 yr old male admitted with dyspnea and chf.  Currently presents at a supervision to min assist level for selfcare tasks and mobility.  Demonstrates occasional LOB and staggering without use of assistive device.  Feel he needs use of a RW for safety to reach modified independent level.  Has all other DME and wife is at home with him.  He reports that he has a can and will likely use this instead, even though I feel the walker is better support for him at this point.  No further acute OT needs at this time or follow-up.    OT Assessment  Patient does not need any further OT services    Follow Up Recommendations  No OT follow up    Barriers to Discharge      Equipment Recommendations  None recommended by OT          Precautions / Restrictions Precautions Precautions: Fall Restrictions Weight Bearing Restrictions: No   Pertinent Vitals/Pain O2 sats 96 % with mobility on room air    ADL  Eating/Feeding: Performed;Independent Where Assessed - Eating/Feeding: Edge of bed Grooming: Performed;Supervision/safety Where Assessed - Grooming: Unsupported standing Upper Body Bathing: Simulated;Set up Where Assessed - Upper Body Bathing: Unsupported sitting Lower Body Bathing: Simulated;Set up Where Assessed - Lower Body Bathing: Unsupported sitting Upper Body Dressing: Simulated;Set up Where Assessed - Upper Body Dressing: Unsupported sitting Lower Body Dressing: Simulated;Supervision/safety Where Assessed - Lower Body Dressing: Unsupported sit to stand Toilet Transfer: Performed;Min guard Toilet Transfer Method: Other (comment) (ambulate with assistive device) Toilet Transfer Equipment: Comfort height toilet;Grab bars Toileting - Clothing  Manipulation and Hygiene: Simulated;Supervision/safety Where Assessed - Engineer, mining and Hygiene: Sit to stand from 3-in-1 or toilet Tub/Shower Transfer: Simulated;Supervision/safety Tub/Shower Transfer Method: Ambulating Transfers/Ambulation Related to ADLs: Pt is overall min guard to min assist level for ambulation without assistive device.  Demonstrates frequent staggering and LOB requiring close supervision. ADL Comments: Pt's o2 sats 96% on room air during mobility.  Does exhibt some dynamic balance issues but feel he would do fine compensating with use of walker.  Pt reports that he usually uses a cane and this is likely what he will continue to do.       Visit Information  Last OT Received On: 01/01/13 Assistance Needed: +1    Subjective Data  Subjective: Yep I only sleep about 4 hours a night on a good day. Patient Stated Goal: Get back to working.   Prior Functioning     Home Living Lives With: Spouse Type of Home: House Home Access: Stairs to enter Entrance Stairs-Number of Steps: 1 Home Layout: One level Bathroom Shower/Tub: Walk-in shower;Door Bathroom Toilet: Handicapped height Bathroom Accessibility: Yes Home Adaptive Equipment: Straight cane;Shower chair with back;Grab bars around toilet;Grab bars in shower;Wheelchair - manual Prior Function Level of Independence: Independent Able to Take Stairs?: Yes Driving: Yes Vocation: Part time employment Communication Communication: HOH Dominant Hand: Right         Vision/Perception Vision - History Baseline Vision: Bifocals Patient Visual Report: No change from baseline Vision - Assessment Eye Alignment: Within Functional Limits Vision Assessment: Vision not tested Perception Perception: Within Functional Limits Praxis Praxis: Intact   Cognition  Cognition Overall Cognitive Status: Appears within functional limits for tasks assessed/performed Arousal/Alertness:  Awake/alert Orientation Level:  Appears intact for tasks assessed Behavior During Session: [facility] for tasks performed    Extremity/Trunk Assessment Right Upper Extremity Assessment RUE ROM/Strength/Tone: Within functional levels RUE Sensation: WFL - Light Touch RUE Coordination: WFL - gross/fine motor Left Upper Extremity Assessment LUE ROM/Strength/Tone: Within functional levels LUE Sensation: WFL - Light Touch LUE Coordination: WFL - gross/fine motor Trunk Assessment Trunk Assessment: Kyphotic     Mobility Bed Mobility Bed Mobility: Supine to Sit Supine to Sit: 6: Modified independent (Device/Increase time) Transfers Transfers: Sit to Stand Sit to Stand: 6: Modified independent (Device/Increase time);With upper extremity assist;From bed Stand to Sit: 6: Modified independent (Device/Increase time);With upper extremity assist        Balance Balance Balance Assessed: Yes Dynamic Standing Balance Dynamic Standing - Level of Assistance: 4: Min assist   End of Session OT - End of Session Activity Tolerance: Patient tolerated treatment well Patient left: in bed;with call bell/phone within reach Nurse Communication: Mobility status     MCGUIRE,JAMES OTR/L Pager number [phone] 01/01/2013, 8:55 AM

## 2013-01-01 NOTE — Care Management Note (Unsigned)
    Page 1 of 1   01/01/2013     4:32:24 PM   CARE MANAGEMENT NOTE 01/01/2013  Patient:  Brian Underwood, Brian Underwood   Account Number:  [id number]  Date Initiated:  01/01/2013  Documentation initiated by:  AMERSON,JULIE  Subjective/Objective Assessment:   PT ADM WITH SOB, PLEURAL EFFUSION ON 12/30/12.  PTA, PT INDEPENDENT, LIVES WITH SPOUSE AND IS INDEPENDENT.     Action/Plan:   WILL FOLLOW FOR HOME NEEDS AS PT PROGRESSES.   Anticipated DC Date:  01/02/2013   Anticipated DC Plan:  HOME W HOME HEALTH SERVICES      DC Planning Services  CM consult      Choice offered to / List presented to:             Status of service:  In process, will continue to follow Medicare Important Message given?   (If response is "NO", the following Medicare IM given date fields will be blank) Date Medicare IM given:   Date Additional Medicare IM given:    Discharge Disposition:    Per UR Regulation:  Reviewed for med. necessity/level of care/duration of stay  If discussed at Long Length of Stay Meetings, dates discussed:    Comments:

## 2013-01-01 NOTE — Evaluation (Signed)
SLP reviewed and agree with student findings.   Leah McCoy MA, CCC-SLP [phone]    

## 2013-01-01 NOTE — Progress Notes (Signed)
[facility] PROGRESS NOTE  Brian Underwood ZOX:[id number] DOB: Jul 30, 1918 DOA: 12/30/2012 PCP: Ezequiel Kayser, MD  Assessment/Plan: Principal Problem:   Dyspnea Active Problems:   AF (atrial fibrillation)   LBBB (left bundle branch block)   AAA (abdominal aortic aneurysm), 5.1 X 4.7   Chronic lymphoblastic leukemia   AI (aortic insufficiency), 2D 2003   HTN (hypertension)   Pleural effusion   Systolic and diastolic CHF, acute on chronic   CKD (chronic kidney disease), stage III   Cardiac pacemaker in situ    1. Dyspnea: Possibly secondary to acute on chronic systolic and diastolic CHF and left pleural effusion (may be transudative from CHF), possible PNA . Admitted to telemetry. Followup chest x-ray and outstanding labs. Cycle troponins. Daily weights and strict I and O's. IV Lasix 60 mg every 12 hourly, change Lasix to by mouth today. . Continue home ARB and beta blockers. Patient had a recent echo and hence will not repeat. EF of 45-50% Monitor closely and consider cardiology consultation if he does not improve soon. Involuntary yelling sound is probably secondary to worsening dyspnea. If he does not improve with above measures, CT scan of the chest showed a large left pleural effusion, the patient will need thoracentesis we'll try to schedule this for tomorrow.Continue abx, no signs of aspiration per speech therapy 2. Acute on chronic systolic and diastolic CHF: Management as above. 3. Left pleural effusion: Possibly from decompensated CHF. Patient gives no history suggestive of infective process at this time. We'll try to arrange for left thoracentesis in the morning, 4. Atrial fibrillation/pacemaker/LBBB: Patient currently in V. paced rhythm. Continue carvedilol. Aspirin causes nosebleeds and not a anticoagulation candidate secondary to high bleeding risk ("free bleeder") and advanced age. 5. Hypertension: Controlled. Continue low dose carvedilol and Avapro. 6. Chronic kidney  disease stage II-3: Last creatinine in January was 1.28. Followup repeat labs. 7. CLL/leukocytosis/thrombocytopenia: Follow CBCs.  8. Large fusiform AAA measuring upwards of 7 cm diameter by CT abdomen 06/07/2012: Asymptomatic of abdominal pain. 9. Rotated, migrated left kidney: Either this or the AAA is felt in the left side of the abdomen.  10. Macrocytic anemia we'll order an anemia panel    Code Status: full  Family Communication: family updated about patient's clinical progress  Disposition Plan: As above    Brief narrative:  79 y.o. male with extensive past medical and surgical history including but not limited to CLL, CKD stage-3, HTN, HL, aortic insufficiency, slow A. fib status post pacemaker, abdominal aortic aneurysm, chronic systolic and diastolic CHF, cardiomyopathy with EF 25-35% 08/15/2013 has been sent by PCP as a direct admission for evaluation and management of worsening dyspnea, leg swelling and involuntary yelling sounds. Patient indicates that he was in his usual state of health until approximately 3 weeks ago when she noticed worsening of involuntary yelling (history of chronic pseudo-bulbar affect). However over the last 3-4 days this has worsened to almost every couple of minutes, worse on lying down and better sitting up, has been associated with worsening dyspnea on exertion, orthopnea and leg swellings. Patient denies chest pains. He has nonproductive cough. No fever or chills. He has been unable to sleep in the last 2 nights. He claims compliance with his medications and sodium restricted diet. For these symptoms he went to see his PCP were chest x-ray was suggestive of left pleural effusion half way up the patient was sent directly to [facility] for further evaluation and management. No current labs are available.  Consultants:  None Procedures:  None Antibiotics:  Assessment/azithromycin started 3/10  HPI/Subjective:  Dyspnea  improved*   Objective: Filed Vitals:   01/01/13 0418 01/01/13 0500 01/01/13 0847 01/01/13 1426  BP: 124/69   123/72  Pulse: 71  75 73  Temp: 97.5 F (36.4 C)   98 F (36.7 C)  TempSrc: Oral   Oral  Resp: 18   18  Height:      Weight:  74.163 kg (163 lb 8 oz)    SpO2: 94%  96% 96%    Intake/Output Summary (Last 24 hours) at 01/01/13 1515 Last data filed at 01/01/13 1300  Gross per 24 hour  Intake    890 ml  Output   3800 ml  Net  -2910 ml    Exam:  HENT:  Head: Atraumatic.  Nose: Nose normal.  Mouth/Throat: Oropharynx is clear and moist.  Eyes: Conjunctivae are normal. Pupils are equal, round, and reactive to light. No scleral icterus.  Neck: Neck supple. No tracheal deviation present.  Cardiovascular: Normal rate, regular rhythm, normal heart sounds and intact distal pulses.  Pulmonary/Chest: Effort normal and breath sounds normal. No respiratory distress.  Abdominal: Soft. Normal appearance and bowel sounds are normal. She exhibits no distension. There is no tenderness.  Musculoskeletal: She exhibits no edema and no tenderness.  Neurological: She is alert. No cranial nerve deficit.    Data Reviewed: Basic Metabolic Panel:  Recent Labs Lab 12/30/12 1937 12/31/12 0727 01/01/13 0545  NA 140 143 142  K 5.2* 4.4 4.3  CL 106 106 103  CO2 25 30 27   GLUCOSE 98 79 95  BUN 42* 43* 46*  CREATININE 1.44* 1.59* 1.60*  CALCIUM 9.4 9.2 8.9    Liver Function Tests:  Recent Labs Lab 12/30/12 1937 01/01/13 0545  AST 19 16  ALT 12 9  ALKPHOS 89 81  BILITOT 0.5 0.6  PROT 6.1 5.7*  ALBUMIN 3.5 3.3*   No results found for this basename: LIPASE, AMYLASE,  in the last 168 hours No results found for this basename: AMMONIA,  in the last 168 hours  CBC:  Recent Labs Lab 12/30/12 1937 12/31/12 0727 01/01/13 0545  WBC 16.4* 15.7* 18.2*  HGB 13.0 12.4* 12.4*  HCT 38.3* 37.2* 36.4*  MCV 108.8* 111.4* 108.3*  PLT 88* 85* 84*    Cardiac Enzymes:  Recent  Labs Lab 12/30/12 1924 12/31/12 0054 12/31/12 0727  TROPONINI <0.30 <0.30 <0.30   BNP (last 3 results) No results found for this basename: PROBNP,  in the last 8760 hours   CBG: No results found for this basename: GLUCAP,  in the last 168 hours  No results found for this or any previous visit (from the past 240 hour(s)).   Studies: Dg Chest 2 View  12/30/2012  *RADIOLOGY REPORT*  Clinical Data: Dyspnea  CHEST - 2 VIEW  Comparison: 11/18/2011  Findings: Left chest wall dual lead pacemaker is in stable position with leads terminating in the right atrium right ventricle. The heart appears enlarged.  The left heart border is obscured by the left basilar opacity. There is a prominent left basilar opacity and small to moderate left pleural effusion.  There may be a tiny right pleural effusion.  Chronic peribronchial thickening noted.  No acute osseous abnormality.  IMPRESSION: Left basilar opacity with associated small to moderate left pleural effusion.  Findings are suspicious for pneumonia with a parapneumonic effusion.  Radiographic follow-up to clearing is recommended.   Original Report Authenticated By: Britta Mccreedy,  M.D.    Ct Chest Wo Contrast  12/31/2012  *RADIOLOGY REPORT*  Clinical Data: Evaluate pleural effusion.  CT CHEST WITHOUT CONTRAST  Technique:  Multidetector CT imaging of the chest was performed following the standard protocol without IV contrast.  Comparison: Chest x-ray 12/30/2012.  Findings: There is a moderate-to-large left pleural effusion with small to moderate right pleural effusion.  Compressive atelectasis in the left lower lobe.  Airspace disease in the right lower lobe could reflect atelectasis or infiltrate.  Probable lingular scarring or atelectasis.  There is cardiomegaly.  Left pacer in place with leads in the right atrium and right ventricle.  Diffuse coronary artery calcifications.  Aorta is ectatic, with maximum diameter in the aortic arch of 3.9 cm. No visible  mediastinal, hilar or axillary adenopathy.  On the lowest image within the upper abdomen, the aorta swings markedly to the left, displacing the left kidney anteriorly and is aneurysmal measuring up to 5 cm.  I cannot exclude that this enlarges further more inferiorly.  Cystic areas within the left kidney which cannot be fully characterize without intravenous contrast.  Severe degenerative changes in the lower thoracic and upper lumbar spine.  IMPRESSION: A moderate-to-large left pleural effusion with small to moderate right pleural effusion.  Compressive atelectasis in the left lower lobe.  Patchy opacity in the right lung base could represent atelectasis or infiltrate.  Lingular atelectasis or scarring.  5.0 cm abdominal aortic aneurysm on the lowest image within the abdomen.  I cannot exclude that this enlarges further more inferiorly.   Original Report Authenticated By: Charlett Nose, M.D.     Scheduled Meds: . acetaminophen  650 mg Oral BID  . amLODipine  2.5 mg Oral Daily  . azithromycin  250 mg Oral Q supper  . carvedilol  6.25 mg Oral BID WC  . cefTRIAXone (ROCEPHIN)  IV  1 g Intravenous Q24H  . cholecalciferol  2,000 Units Oral Daily  . feeding supplement  237 mL Oral Daily  . furosemide  40 mg Oral BID  . irbesartan  150 mg Oral Daily  . omega-3 acid ethyl esters  3 g Oral Daily  . pantoprazole  40 mg Oral Daily  . potassium chloride  10 mEq Oral BID  . sodium chloride  3 mL Intravenous Q12H  . sodium chloride  3 mL Intravenous Q12H  . vitamin B-12  1,000 mcg Oral Daily  . vitamin C  1,000 mg Oral Daily   Continuous Infusions:   Principal Problem:   Dyspnea Active Problems:   AF (atrial fibrillation)   LBBB (left bundle branch block)   AAA (abdominal aortic aneurysm), 5.1 X 4.7   Chronic lymphoblastic leukemia   AI (aortic insufficiency), 2D 2003   HTN (hypertension)   Pleural effusion   Systolic and diastolic CHF, acute on chronic   CKD (chronic kidney disease), stage III    Cardiac pacemaker in situ    Time spent: 40 minutes   [facility]  [facility] Pager [phone]. If 8PM-8AM, please contact night-coverage at [email or web address], password [facility] 01/01/2013, 3:15 PM  LOS: 2 days

## 2013-01-02 ENCOUNTER — Inpatient Hospital Stay (HOSPITAL_COMMUNITY): Payer: Medicare PPO

## 2013-01-02 MED ORDER — LEVOFLOXACIN 500 MG PO TABS: 250.0000 mg | ORAL_TABLET | Freq: Every day | ORAL | Status: AC

## 2013-01-02 MED ORDER — LEVOFLOXACIN 500 MG PO TABS: 500.0000 mg | ORAL_TABLET | Freq: Every day | ORAL | Status: DC

## 2013-01-02 MED ORDER — FUROSEMIDE 20 MG PO TABS: 80.0000 mg | ORAL_TABLET | Freq: Every morning | ORAL | Status: DC

## 2013-01-02 NOTE — Discharge Summary (Signed)
Physician Discharge Summary  Brian Underwood MRN: [id number] DOB/AGE: 1917-11-04 79 y.o.  PCP: Ezequiel Kayser, MD   Admit date: 12/30/2012 Discharge date: 01/02/2013  Discharge Diagnoses:      Dyspnea Active Problems:   AF (atrial fibrillation)   LBBB (left bundle branch block)   AAA (abdominal aortic aneurysm), 5.1 X 4.7   Chronic lymphoblastic leukemia   AI (aortic insufficiency), 2D 2003   HTN (hypertension)   Pleural effusion   Systolic and diastolic CHF, acute on chronic   CKD (chronic kidney disease), stage III   Cardiac pacemaker in situ     Medication List    STOP taking these medications       lisinopril 10 MG tablet  Commonly known as:  PRINIVIL,ZESTRIL      TAKE these medications       carvedilol 6.25 MG tablet  Commonly known as:  COREG  Take 6.25 mg by mouth 2 (two) times daily with a meal.     chlorthalidone 50 MG tablet  Commonly known as:  HYGROTON  Take 25 mg by mouth daily.     cholecalciferol 1000 UNITS tablet  Commonly known as:  VITAMIN D  Take 2,000 Units by mouth daily.     diphenoxylate-atropine 2.5-0.025 MG per tablet  Commonly known as:  LOMOTIL  Take 2 tablets by mouth 3 (three) times daily as needed for diarrhea or loose stools.     furosemide 20 MG tablet  Commonly known as:  LASIX  Take 4 tablets (80 mg total) by mouth every morning.     Glucosamine-Chondroit-Biofl-Mn Caps  Take 1 capsule by mouth 2 (two) times daily.     hyoscyamine 0.125 MG SL tablet  Commonly known as:  LEVSIN SL  Place 0.125 mg under the tongue every 4 (four) hours as needed (for abdominal pain).     irbesartan 150 MG tablet  Commonly known as:  AVAPRO  Take 150 mg by mouth at bedtime.     levofloxacin 500 MG tablet  Commonly known as:  LEVAQUIN  Take 0.5 tablets (250 mg total) by mouth daily.     NUEDEXTA 20-10 MG Caps  Generic drug:  Dextromethorphan-Quinidine  Take 1 capsule by mouth 2 (two) times daily.     potassium chloride 10 MEQ  tablet  Commonly known as:  K-DUR  Take 10 mEq by mouth 2 (two) times daily.     TYLENOL 325 MG tablet  Generic drug:  acetaminophen  Take 650 mg by mouth 2 (two) times daily. Pain     vitamin B-12 1000 MCG tablet  Commonly known as:  CYANOCOBALAMIN  Take 1,000 mcg by mouth daily.     VITAMIN C PLUS PO  Take 1 tablet by mouth daily.     zolpidem 5 MG tablet  Commonly known as:  AMBIEN  Take 5 mg by mouth at bedtime as needed for sleep.        Discharge Condition: *Stable   Disposition: 01-Home or Self Care   Consults: None   Significant Diagnostic Studies: Dg Chest 2 View  12/30/2012  *RADIOLOGY REPORT*  Clinical Data: Dyspnea  CHEST - 2 VIEW  Comparison: 11/18/2011  Findings: Left chest wall dual lead pacemaker is in stable position with leads terminating in the right atrium right ventricle. The heart appears enlarged.  The left heart border is obscured by the left basilar opacity. There is a prominent left basilar opacity and small to moderate left pleural effusion.  There may  be a tiny right pleural effusion.  Chronic peribronchial thickening noted.  No acute osseous abnormality.  IMPRESSION: Left basilar opacity with associated small to moderate left pleural effusion.  Findings are suspicious for pneumonia with a parapneumonic effusion.  Radiographic follow-up to clearing is recommended.   Original Report Authenticated By: Britta Mccreedy, M.D.    Ct Chest Wo Contrast  12/31/2012  *RADIOLOGY REPORT*  Clinical Data: Evaluate pleural effusion.  CT CHEST WITHOUT CONTRAST  Technique:  Multidetector CT imaging of the chest was performed following the standard protocol without IV contrast.  Comparison: Chest x-ray 12/30/2012.  Findings: There is a moderate-to-large left pleural effusion with small to moderate right pleural effusion.  Compressive atelectasis in the left lower lobe.  Airspace disease in the right lower lobe could reflect atelectasis or infiltrate.  Probable lingular  scarring or atelectasis.  There is cardiomegaly.  Left pacer in place with leads in the right atrium and right ventricle.  Diffuse coronary artery calcifications.  Aorta is ectatic, with maximum diameter in the aortic arch of 3.9 cm. No visible mediastinal, hilar or axillary adenopathy.  On the lowest image within the upper abdomen, the aorta swings markedly to the left, displacing the left kidney anteriorly and is aneurysmal measuring up to 5 cm.  I cannot exclude that this enlarges further more inferiorly.  Cystic areas within the left kidney which cannot be fully characterize without intravenous contrast.  Severe degenerative changes in the lower thoracic and upper lumbar spine.  IMPRESSION: A moderate-to-large left pleural effusion with small to moderate right pleural effusion.  Compressive atelectasis in the left lower lobe.  Patchy opacity in the right lung base could represent atelectasis or infiltrate.  Lingular atelectasis or scarring.  5.0 cm abdominal aortic aneurysm on the lowest image within the abdomen.  I cannot exclude that this enlarges further more inferiorly.   Original Report Authenticated By: Charlett Nose, M.D.        Microbiology: No results found for this or any previous visit (from the past 240 hour(s)).   Labs: Results for orders placed during the hospital encounter of 12/30/12 (from the past 48 hour(s))  VITAMIN B12     Status: Abnormal   Collection Time    12/31/12  9:19 AM      Result Value Range   Vitamin B-12 1066 (*) 211 - 911 pg/mL  FOLATE     Status: None   Collection Time    12/31/12  9:19 AM      Result Value Range   Folate 16.3     Comment: (NOTE)     Reference Ranges            Deficient:       0.4 - 3.3 ng/mL            Indeterminate:   3.4 - 5.4 ng/mL            Normal:              > 5.4 ng/mL  IRON AND TIBC     Status: None   Collection Time    12/31/12  9:19 AM      Result Value Range   Iron 82  42 - 135 ug/dL   TIBC 409  811 - 914 ug/dL    Saturation Ratios 31  20 - 55 %   UIBC 184  125 - 400 ug/dL  FERRITIN     Status: None   Collection Time    12/31/12  9:19 AM      Result Value Range   Ferritin 70  22 - 322 ng/mL  RETICULOCYTES     Status: Abnormal   Collection Time    12/31/12  9:19 AM      Result Value Range   Retic Ct Pct 1.7  0.4 - 3.1 %   RBC. 3.13 (*) 4.22 - 5.81 MIL/uL   Retic Count, Manual 53.2  19.0 - 186.0 K/uL  COMPREHENSIVE METABOLIC PANEL     Status: Abnormal   Collection Time    01/01/13  5:45 AM      Result Value Range   Sodium 142  135 - 145 mEq/L   Potassium 4.3  3.5 - 5.1 mEq/L   Chloride 103  96 - 112 mEq/L   CO2 27  19 - 32 mEq/L   Glucose, Bld 95  70 - 99 mg/dL   BUN 46 (*) 6 - 23 mg/dL   Creatinine, Ser 9.62 (*) 0.50 - 1.35 mg/dL   Calcium 8.9  8.4 - 95.2 mg/dL   Total Protein 5.7 (*) 6.0 - 8.3 g/dL   Albumin 3.3 (*) 3.5 - 5.2 g/dL   AST 16  0 - 37 U/L   ALT 9  0 - 53 U/L   Alkaline Phosphatase 81  39 - 117 U/L   Total Bilirubin 0.6  0.3 - 1.2 mg/dL   GFR calc non Af Amer 35 (*) >90 mL/min   GFR calc Af Amer 41 (*) >90 mL/min   Comment:            The eGFR has been calculated     using the CKD EPI equation.     This calculation has not been     validated in all clinical     situations.     eGFR's persistently     <90 mL/min signify     possible Chronic Kidney Disease.  CBC     Status: Abnormal   Collection Time    01/01/13  5:45 AM      Result Value Range   WBC 18.2 (*) 4.0 - 10.5 K/uL   RBC 3.36 (*) 4.22 - 5.81 MIL/uL   Hemoglobin 12.4 (*) 13.0 - 17.0 g/dL   HCT 84.1 (*) 32.4 - 40.1 %   MCV 108.3 (*) 78.0 - 100.0 fL   MCH 36.9 (*) 26.0 - 34.0 pg   MCHC 34.1  30.0 - 36.0 g/dL   RDW 02.7  25.3 - 66.4 %   Platelets 84 (*) 150 - 400 K/uL   Comment: CONSISTENT WITH PREVIOUS RESULT     HPI :* 79 y.o. male with extensive past medical and surgical history including but not limited to CLL, CKD stage-3, HTN, HL, aortic insufficiency, slow A. fib status post pacemaker,  abdominal aortic aneurysm, chronic systolic and diastolic CHF, cardiomyopathy with EF 25-35% 08/15/2013 has been sent by PCP as a direct admission for evaluation and management of worsening dyspnea, leg swelling and involuntary yelling sounds. Patient indicates that he was in his usual state of health until approximately 3 weeks ago when she noticed worsening of involuntary yelling (history of chronic pseudo-bulbar affect). However over the last 3-4 days this has worsened to almost every couple of minutes, worse on lying down and better sitting up, has been associated with worsening dyspnea on exertion, orthopnea and leg swellings. Patient denies chest pains. He has nonproductive cough. No fever or chills. He has been unable to sleep in the last 2 nights.  He claims compliance with his medications and sodium restricted diet. For these symptoms he went to see his PCP were chest x-ray was suggestive of left pleural effusion half way up the patient was sent directly to [facility] for further evaluation and management. No current labs are available.    HOSPITAL COURSE: * 1. Dyspnea: Multifactorial secondary to acute on chronic systolic and diastolic CHF and left pleural effusion (may be transudative from CHF), possible PNA .  Admitted to telemetry. CT scan was done with results as above. Cardiac enzymes were negative, the patient was treated with IV Lasix 60 mg every 12 hourly, has been switched to Lasix by mouth daily 80 mg a day in the morning. . Continue home ARB and beta blockers. Patient's MAR indicated lisinopril which has been discontinued. Patient had a recent echo and hence will not repeat. EF of 45-50% . Patient was found to have bilateral pleural effusions most likely transudate secondary to congestive heart failure. Involuntary yelling sound is probably secondary to worsening dyspnea. Patient will continue antibiotics, treated with azithromycin, Rocephin in the hospital, switched to levofloxacin  renally dosed for another 5 days He has also agreed to a left sided thoracentesis which will be done today prior to his discharge, no signs of aspiration per speech therapy   2. Acute on chronic systolic and diastolic CHF: Management as above. 3. Left pleural effusion: Possibly from decompensated CHF. Left sided thoracentesis today. Diuretics on a daily basis when the patient is discharged 4. Atrial fibrillation/pacemaker/LBBB: Patient currently in V. paced rhythm. Continue carvedilol. Aspirin causes nosebleeds and not a anticoagulation candidate secondary to high bleeding risk ("free bleeder") and advanced age. 5. Hypertension: Controlled. Continue low dose carvedilol and Avapro. 6. Chronic kidney disease stage II-3: Last creatinine in January was 1.28. From review of his labs the patient's creatinine appears to be at his baseline CLL/leukocytosis/thrombocytopenia: Chronically elevated WBC count 7. Large fusiform AAA measuring upwards of 7 cm diameter by CT abdomen 06/07/2012: Asymptomatic of abdominal pain.      Discharge Exam:   Blood pressure 120/70, pulse 72, temperature 98.3 F (36.8 C), temperature source Oral, resp. rate 18, height 6\' 2"  (1.88 m), weight 74.3 kg (163 lb 12.8 oz), SpO2 93.00%.  General exam: Moderately built and nourished elderly male patient,sitting up in bed without any obvious distress.  Head, eyes and ENT: Nontraumatic and normocephalic. Pupils equally reacting to light and accommodation. Oral mucosa moist.  Neck: Supple. No JVD, carotid bruit or thyromegaly.  Lymphatics: No lymphadenopathy.  Respiratory system: reduced breath sounds bilaterally especially in the bases left greater than right with a few basal crackles. Upper lung fields are relatively clear to auscultation . No increased work of breathing.  Cardiovascular system: S1 and S2 heard, RRR. No JVD, murmurs, gallops, clicks.2+ pitting bilateral leg edema to the knees.  Gastrointestinal system: Abdomen is  nondistended, soft and nontender. Normal bowel sounds heard. Patient has a pulsatile, nontender mobile mass in the left upper and mid quadrant  Central nervous system: Alert and oriented. No focal neurological deficits.  Extremities: Symmetric 5 x 5 power. Peripheral pulses difficult to appreciate secondary to edema  Skin: No rashes or acute findings.  Musculoskeletal system: Negative exam.  Psychiatry: Pleasant and cooperative.          Future Appointments Provider Department Dept Phone   01/02/2013 9:20 AM [facility] ULTRASOUND [phone]   03/06/2013 8:00 AM Mauri [facility] MEDICAL ONCOLOGY [phone]  03/06/2013 8:30 AM Ladene Artist, MD [facility] MEDICAL ONCOLOGY [phone]   05/27/2013 9:00 AM [facility] [phone]   Eat a light meal the night before the exam but please avoid gaseous foods.   Nothing to eat or drink for at least 8 hours prior to the exam. No gum chewing or smoking the morning of the exam. Please take your morning medications with small sips of water, especially blood pressure medication. If you have several vascular lab exams and will see physician, please bring a snack with you.   05/27/2013 9:30 AM Larina Earthly, MD [facility] [phone]        Signed: Richarda Overlie 01/02/2013, 9:18 AM

## 2013-01-02 NOTE — Progress Notes (Signed)
Discharge instructions along with med list and scripts provided. IV d/c'd with catheter intact. Awaiting family for d/c home. Mamie Levers

## 2013-01-02 NOTE — Procedures (Signed)
Successful US guided left thoracentesis. Yielded of straw colored fluid. Pt tolerated procedure well. No immediate complications.  Specimen was not sent for labs. CXR ordered.  Brayton El PA-C 01/02/2013 9:52 AM

## 2013-01-02 NOTE — Progress Notes (Signed)
PT note:  Attempted to see pt 2x today, but unavailable on both attempts. Will continue to follow. Clydie Braun L. Katrinka Blazing, [place] Pager [phone] 01/02/2013

## 2013-02-24 ENCOUNTER — Other Ambulatory Visit: Payer: Self-pay | Admitting: Cardiology

## 2013-02-24 ENCOUNTER — Ambulatory Visit
Admission: RE | Admit: 2013-02-24 | Discharge: 2013-02-24 | Disposition: A | Discharge status: Home / Self Care | Payer: Medicare PPO | Source: Ambulatory Visit | Attending: Cardiology | Admitting: Cardiology

## 2013-02-24 DIAGNOSIS — R52 Pain, unspecified: Secondary | ICD-10-CM

## 2013-03-06 ENCOUNTER — Ambulatory Visit: Payer: Medicare PPO | Admitting: Oncology

## 2013-03-06 ENCOUNTER — Other Ambulatory Visit: Payer: Medicare PPO | Admitting: Lab

## 2013-03-07 ENCOUNTER — Other Ambulatory Visit: Payer: Self-pay | Admitting: *Deleted

## 2013-03-07 DIAGNOSIS — IMO0001 Reserved for inherently not codable concepts without codable children: Secondary | ICD-10-CM

## 2013-03-10 ENCOUNTER — Telehealth: Payer: Self-pay | Admitting: Oncology

## 2013-03-10 NOTE — Telephone Encounter (Signed)
s.w. pt wife attempted to r/s missed appt...she did not want to r/s and he will call to r/s

## 2013-03-31 ENCOUNTER — Telehealth: Payer: Self-pay | Admitting: Cardiovascular Disease

## 2013-03-31 NOTE — Telephone Encounter (Signed)
Been calling for  over a week-need his Lisinopril 10mg -Please call this in today! [phone]

## 2013-04-06 ENCOUNTER — Other Ambulatory Visit: Payer: Self-pay | Admitting: Cardiovascular Disease

## 2013-04-07 ENCOUNTER — Ambulatory Visit: Payer: Medicare PPO | Admitting: Cardiovascular Disease

## 2013-04-07 NOTE — Telephone Encounter (Signed)
Call to pt and informed request for lisinopril received and per records, it was dc'd in April 2014.  Pt stated he will check w/ Dr. Earlene Plater and call back.  Returned call to pharmacy and informed Irving Burton that med was dc'd in April and pt is checking w/ another doctor r/t med.  Will call back when more information available.

## 2013-04-08 ENCOUNTER — Encounter: Payer: Self-pay | Admitting: *Deleted

## 2013-04-09 ENCOUNTER — Encounter: Payer: Self-pay | Admitting: Cardiovascular Disease

## 2013-04-09 ENCOUNTER — Ambulatory Visit (INDEPENDENT_AMBULATORY_CARE_PROVIDER_SITE_OTHER): Payer: Medicare PPO | Admitting: Cardiovascular Disease

## 2013-04-09 VITALS — BP 94/56 | HR 80 | Resp 20 | Ht 72.0 in | Wt 162.3 lb

## 2013-04-09 DIAGNOSIS — I359 Nonrheumatic aortic valve disorder, unspecified: Secondary | ICD-10-CM

## 2013-04-09 DIAGNOSIS — IMO0001 Reserved for inherently not codable concepts without codable children: Secondary | ICD-10-CM

## 2013-04-09 DIAGNOSIS — I351 Nonrheumatic aortic (valve) insufficiency: Secondary | ICD-10-CM

## 2013-04-09 DIAGNOSIS — I509 Heart failure, unspecified: Secondary | ICD-10-CM

## 2013-04-09 DIAGNOSIS — C9100 Acute lymphoblastic leukemia not having achieved remission: Secondary | ICD-10-CM

## 2013-04-09 DIAGNOSIS — Z950 Presence of cardiac pacemaker: Secondary | ICD-10-CM

## 2013-04-09 DIAGNOSIS — I4891 Unspecified atrial fibrillation: Secondary | ICD-10-CM

## 2013-04-09 DIAGNOSIS — I714 Abdominal aortic aneurysm, without rupture: Secondary | ICD-10-CM

## 2013-04-09 DIAGNOSIS — I5043 Acute on chronic combined systolic (congestive) and diastolic (congestive) heart failure: Secondary | ICD-10-CM

## 2013-04-09 NOTE — Assessment & Plan Note (Signed)
Followed by Dr. Truett Perna, associated with minimal anemia and mild thrombocytopenia, not currently on therapy

## 2013-04-09 NOTE — Assessment & Plan Note (Signed)
Baseline serum creatinine around 1.4, corresponding to a creatinine clearance of roughly 40 mL per minute

## 2013-04-09 NOTE — Patient Instructions (Addendum)
Your physician recommends that you schedule a follow-up appointment in 6 weeks with a physicians assistant.  Then see Dr. Royann Shivers in 3 months.  Continue same medications.

## 2013-04-09 NOTE — Assessment & Plan Note (Addendum)
[employer] DR RF model PM 2210 serial number [id number], implanted generates 2013 for symptomatic bradycardia. The device is functioning normally by recent check. RV pacing may be contributing to heart failure, but he does not wish, nor is he an appropriate candidate for resynchronization therapy. Also note that he has an underlying left bundle branch block and even when not right ventricular paced is likely have asynchrony.

## 2013-04-09 NOTE — Assessment & Plan Note (Signed)
Permanent atrial fibrillation. Not on anticoagulation therapy due to history of severe bleeding. He is unable to take aspirin since he has had severe epistaxis requiring blood transfusion even on low-dose aspirin. He has a history of intermittent high-grade atrioventricular block and a single chamber permanent pacemaker.

## 2013-04-09 NOTE — Assessment & Plan Note (Signed)
Large aneurysm followed by Dr. Darrick Penna. He has requested no invasive/aggressive interventions.

## 2013-04-09 NOTE — Progress Notes (Signed)
Patient ID: Brian Underwood, male   DOB: 08-20-1918, 79 y.o.   MRN: [id number]      Reason for office visit Followup congestive heart failure  Brian Underwood is an elderly gentleman with mildly depressed left ventricular systolic function, contractile asynchrony related to left bundle branch block and right ventricular pacing, permanent atrial fibrillation, a history of severe epistaxis and a very large abdominal aortic aneurysm as well as numerous noncardiac comorbidity such as chronic lymphocytic leukemia and chronic kidney disease stage III.  Recently he has been troubled by repeated episodes of acute exacerbation of chronic heart failure requiring escalation of diuretic therapy. He has been very conscientious in keeping a record of his weight and has been carefully avoiding sodium. Since his last office visit he has maintained decent status of compensation. His weight is not much changed on the current diuretic regimen. He weighs roughly 150-152 pounds on his home scale, while our office scale shows about 10 pounds more  He denies dyspnea at rest or with exertion but has fairly profound fatigue. He is chronically mildly hypotensive with systolic blood pressure in the mid 90s, as it is today. He required treatment with beta blocker for control of atrial fibrillation as well as to prevent expansion/rupture of a large abdominal aortic aneurysm.  In addition to hypertension he has a lot of difficulty sleeping. His wife is elderly and unsteady and has fallen recently. He tries to be awake and alert when she walks in the house at night to go to the bathroom. He is also troubled by severe financial problems and has been engaged in a long-term judicial dispute with the state of [place]. This weighs on his mind at night.  He has not had any problems with angina. When he has episodes of heart failure exacerbation there is often an unusual worsening of pseudo-bulbar symptoms with uncontrolled yelling. He  has not had any other focal cortical deficits and has not had any recent bleeding problems. He continues to have mild ankle edema.     Allergies  Allergen Reactions  . Aspirin     Nose bleed  . Escitalopram Oxalate   . Ramipril     Current Outpatient Prescriptions  Medication Sig Dispense Refill  . acetaminophen (TYLENOL) 325 MG tablet Take 650 mg by mouth 2 (two) times daily. Pain      . Bioflavonoid Products (VITAMIN C PLUS PO) Take 1 tablet by mouth 2 (two) times daily.       . carvedilol (COREG) 6.25 MG tablet Take 6.25 mg by mouth 2 (two) times daily with a meal.      . cholecalciferol (VITAMIN D) 1000 UNITS tablet Take 1,000 Units by mouth 2 (two) times daily.       . furosemide (LASIX) 20 MG tablet Take 40 mg by mouth daily.      . Glucosamine-Chondroit-Biofl-Mn CAPS Take 1 capsule by mouth 2 (two) times daily.      . meclizine (ANTIVERT) 25 MG tablet Take 25 mg by mouth 3 (three) times daily as needed.      . mupirocin ointment (BACTROBAN) 2 % Apply 1 application topically as needed.      . potassium chloride (K-DUR) 10 MEQ tablet Take 20 mEq by mouth daily.       . vitamin B-12 (CYANOCOBALAMIN) 1000 MCG tablet Take 1,000 mcg by mouth daily.      Marland Kitchen zolpidem (AMBIEN) 5 MG tablet Take 5 mg by mouth at bedtime as needed for sleep.  No current facility-administered medications for this visit.    Past Medical History  Diagnosis Date  . Renal disorder   . Abnormal involuntary movements(781.0)   . Viral infection   . Hypogonadism male   . Fatigue   . Orthostatic lightheadedness   . Acute diarrhea   . Abdominal pain of unknown etiology   . Osteopenia   . Microalbuminuria   . Left bundle branch block   . Atrioventricular block, first degree   . Aortic insufficiency   . Abdominal aortic aneurysm   . History of benign prostatic hypertrophy   . Chronic renal disease, stage III   . Graves disease   . Hyperlipidemia   . Insomnia   . Health maintenance examination     . CLL (chronic lymphoblastic leukemia)   . Heart murmur   . Hypertension   . Arthritis   . Skin cancer of upper limb or shoulder     Right shoulder cancer removal  . Skin cancer of face     nose and forehead  . Heart failure, systolic and diastolic   . Atrial fibrillation   . Presence of permanent cardiac pacemaker 11/17/11    [employer]    Past Surgical History  Procedure Laterality Date  . Hernia repair    . Right foot    . Eye surgery    . Pacemaker insertion  11/17/2011    [employer]    Family History  Problem Relation Age of Onset  . Stroke Mother   . Stroke Father   . Cancer Brother   . Cancer Sister   . Cancer Brother   . Cancer Sister     History   Social History  . Marital Status: Married    Spouse Name: N/A    Number of Children: N/A  . Years of Education: N/A   Occupational History  . Not on file.   Social History Main Topics  . Smoking status: Never Smoker   . Smokeless tobacco: Never Used  . Alcohol Use: No  . Drug Use: No  . Sexually Active: Not on file   Other Topics Concern  . Not on file   Social History Narrative  . No narrative on file    Review of systems: He has ankle edema, profound fatigue, mild gait instability, uncontrolled yelling. The patient specifically denies any chest pain at rest or with exertion, dyspnea at rest, orthopnea, paroxysmal nocturnal dyspnea, syncope, palpitations, new focal neurological deficits, intermittent claudication,  unexplained weight gain, cough, hemoptysis or wheezing.  The patient also denies abdominal pain, nausea, vomiting, dysphagia, diarrhea, constipation, polyuria, polydipsia, dysuria, hematuria, frequency, urgency, abnormal bleeding or bruising, fever, chills, unexpected weight changes, mood swings, change in skin or hair texture, change in voice quality, auditory or visual problems, allergic reactions or rashes, new musculoskeletal complaints other than usual "aches and pains".   PHYSICAL  EXAM BP 94/56  Pulse 80  Resp 20  Ht 6' (1.829 m)  Wt 73.619 kg (162 lb 4.8 oz)  BMI 22.01 kg/m2  General: Alert, oriented x3, no distress; appears elderly and frail Head: no evidence of trauma, PERRL, EOMI, no exophtalmos or lid lag, no myxedema, no xanthelasma; normal ears, nose and oropharynx Neck: normal jugular venous pulsations and no hepatojugular reflux; brisk carotid pulses without delay and no carotid bruits Chest: clear to auscultation, no signs of consolidation by percussion or palpation, normal fremitus, symmetrical and full respiratory excursions Cardiovascular: normal position and quality of the apical impulse, regular  rhythm, normal first and paradoxically split second heart sound, grade 1/6 decrescendo aortic murmur at the right lower sternal border and 2/6 aortic ejection murmur, no rubs or gallops Abdomen: no tenderness or distention, no masses by palpation, pulsatile pelvic mass is felt , scattered abdominal systolic arterial bruits, normal bowel sounds, no hepatosplenomegaly Extremities: no clubbing, cyanosis; 1-2+ ankle edema; 2+ radial, ulnar and brachial pulses bilaterally; 2+ right femoral, posterior tibial and dorsalis pedis pulses; 2+ left femoral, posterior tibial and dorsalis pedis pulses; no subclavian or femoral bruits Neurological: grossly nonfocal, slow slightly unsteady gait   EKG: Background atrial fibrillation with 100% ventricular pacing  Lipid Panel  No results found for this basename: chol, trig, hdl, cholhdl, vldl, ldlcalc    BMET    Component Value Date/Time   NA 142 01/01/2013 0545   K 4.3 01/01/2013 0545   CL 103 01/01/2013 0545   CO2 27 01/01/2013 0545   GLUCOSE 95 01/01/2013 0545   BUN 46* 01/01/2013 0545   CREATININE 1.60* 01/01/2013 0545   CALCIUM 8.9 01/01/2013 0545   GFRNONAA 35* 01/01/2013 0545   GFRAA 41* 01/01/2013 0545     ASSESSMENT AND PLAN Systolic and diastolic CHF, acute on chronic He is suspected to have ischemic  cardiomyopathy, but do to a desire to avoid any invasive or contrast based therapies he has never undergone coronary angiography. Part of his depressed systolic function may also be related to asynchrony in the setting of right ventricular pacing. Currently appears to be NYHA functional class II and close to euvolemic status. He still has some residual lower showed edema but he's not short of breath. We'll maintain the same diuretic regimen. Note presence of chronic renal insufficiency and need for judicious management of volemia. He is unable to take ACE inhibitor/ARB therapy secondary to renal insufficiency and hypotension. For the same reason the dose of carvedilol has not been escalated further.  AF (atrial fibrillation) Permanent atrial fibrillation. Not on anticoagulation therapy due to history of severe bleeding. He is unable to take aspirin since he has had severe epistaxis requiring blood transfusion even on low-dose aspirin. He has a history of intermittent high-grade atrioventricular block and a single chamber permanent pacemaker.  CKD (chronic kidney disease), stage III Baseline serum creatinine around 1.4, corresponding to a creatinine clearance of roughly 40 mL per minute  Cardiac pacemaker in situ [employer] Accent DR RF model PM 2210 serial number [id number], implanted generates 2013 for symptomatic bradycardia. The device is functioning normally by recent check. RV pacing may be contributing to heart failure, but he does not wish, nor is he an appropriate candidate for resynchronization therapy. Also note that he has an underlying left bundle branch block and even when not right ventricular paced is likely have asynchrony.   AAA (abdominal aortic aneurysm), 5.1 X 4.7 Large aneurysm followed by Dr. Darrick Penna. He has requested no invasive/aggressive interventions.   Chronic lymphoblastic leukemia Followed by Dr. Truett Perna, associated with minimal anemia and mild thrombocytopenia, not  currently on therapy  AI (aortic insufficiency) Possibly contributing to congestive heart failure. Not a candidate for vasodilator therapy due to hypotension. Previously on angiotensin receptor blockers these had to be discontinued.  No orders of the defined types were placed in this encounter.   No orders of the defined types were placed in this encounter.    Junious Silk, MD, [facility] [phone] office [phone] pager

## 2013-04-09 NOTE — Assessment & Plan Note (Addendum)
He is suspected to have ischemic cardiomyopathy, but do to a desire to avoid any invasive or contrast based therapies he has never undergone coronary angiography. Part of his depressed systolic function may also be related to asynchrony in the setting of right ventricular pacing. Currently appears to be NYHA functional class II and close to euvolemic status. He still has some residual lower showed edema but he's not short of breath. We'll maintain the same diuretic regimen. Note presence of chronic renal insufficiency and need for judicious management of volemia. He is unable to take ACE inhibitor/ARB therapy secondary to renal insufficiency and hypotension. For the same reason the dose of carvedilol has not been escalated further.

## 2013-04-09 NOTE — Assessment & Plan Note (Signed)
Possibly contributing to congestive heart failure. Not a candidate for vasodilator therapy due to hypotension. Previously on angiotensin receptor blockers these had to be discontinued.

## 2013-04-11 ENCOUNTER — Other Ambulatory Visit: Payer: Self-pay | Admitting: Pharmacist Clinician (PhC)/ Clinical Pharmacy Specialist

## 2013-04-11 MED ORDER — LISINOPRIL 10 MG PO TABS: 10.0000 mg | ORAL_TABLET | Freq: Every day | ORAL | Status: DC

## 2013-04-13 ENCOUNTER — Other Ambulatory Visit: Payer: Self-pay | Admitting: Cardiovascular Disease

## 2013-04-13 DIAGNOSIS — I4891 Unspecified atrial fibrillation: Secondary | ICD-10-CM

## 2013-04-13 LAB — PACEMAKER DEVICE OBSERVATION

## 2013-04-18 ENCOUNTER — Encounter: Payer: Self-pay | Admitting: *Deleted

## 2013-04-18 LAB — REMOTE PACEMAKER DEVICE
BMOD-0002RV: 10
BRDY-0002RV: 70 {beats}/min
BRDY-0004RV: 110 {beats}/min
RV LEAD IMPEDENCE PM: 430 Ohm
VENTRICULAR PACING PM: 99

## 2013-05-21 ENCOUNTER — Ambulatory Visit (INDEPENDENT_AMBULATORY_CARE_PROVIDER_SITE_OTHER): Payer: Medicare PPO | Admitting: Cardiology

## 2013-05-21 ENCOUNTER — Encounter: Payer: Self-pay | Admitting: Cardiology

## 2013-05-21 VITALS — BP 104/50 | HR 72 | Ht 71.0 in | Wt 160.4 lb

## 2013-05-21 DIAGNOSIS — I509 Heart failure, unspecified: Secondary | ICD-10-CM

## 2013-05-21 DIAGNOSIS — I714 Abdominal aortic aneurysm, without rupture, unspecified: Secondary | ICD-10-CM

## 2013-05-21 DIAGNOSIS — C9100 Acute lymphoblastic leukemia not having achieved remission: Secondary | ICD-10-CM

## 2013-05-21 DIAGNOSIS — Z950 Presence of cardiac pacemaker: Secondary | ICD-10-CM

## 2013-05-21 DIAGNOSIS — IMO0001 Reserved for inherently not codable concepts without codable children: Secondary | ICD-10-CM

## 2013-05-21 DIAGNOSIS — I5042 Chronic combined systolic (congestive) and diastolic (congestive) heart failure: Secondary | ICD-10-CM

## 2013-05-21 DIAGNOSIS — I4891 Unspecified atrial fibrillation: Secondary | ICD-10-CM

## 2013-05-21 NOTE — Progress Notes (Signed)
05/21/2013   PCP: Ezequiel Kayser, MD   Chief Complaint  Patient presents with  . Follow-up    reports no problems. follow up from last visit.    Primary Cardiologist: Dr Royann Shivers  HPI:  Brian Underwood is an elderly gentleman with mildly depressed left ventricular systolic function, contractile asynchrony related to left bundle branch block and right ventricular pacing, permanent atrial fibrillation, a history of severe epistaxis and a very large abdominal aortic aneurysm as well as numerous noncardiac comorbidity such as chronic lymphocytic leukemia and chronic kidney disease stage III.  When he has episodes of heart failure exacerbation there is often an unusual worsening of pseudo-bulbar symptoms with uncontrolled yelling. He has not had any other focal cortical deficits and has not had any recent bleeding problems  Patient is not on anticoagulation therapy due to his history of severe bleeding and he is unable to take aspirin since he has had severe epistasis requiring blood transfusions even on low-dose aspirin.  He has a [employer] DR RF model PM 2210, implanted 2013 for symptomatic bradycardia.  Patient is here today for followup and has no specific complaints. He denies chest pain denies shortness of breath his weight is stable. He has no abdominal pain. His weight is running 154-155 and historically here in our office his weight is 10 pounds higher.  He has been doing fairly well his wife is in rehabilitation currently in ER staff her home in 2 weeks.     Allergies  Allergen Reactions  . Aspirin     Nose bleed  . Escitalopram Oxalate   . Ramipril     Current Outpatient Prescriptions  Medication Sig Dispense Refill  . acetaminophen (TYLENOL) 325 MG tablet Take 650 mg by mouth 2 (two) times daily. Pain      . Bioflavonoid Products (VITAMIN C PLUS PO) Take 1 tablet by mouth 2 (two) times daily.       . carvedilol (COREG) 6.25 MG tablet Take 6.25 mg by  mouth 2 (two) times daily with a meal.      . cholecalciferol (VITAMIN D) 1000 UNITS tablet Take 1,000 Units by mouth 2 (two) times daily.       . furosemide (LASIX) 20 MG tablet Take 20 mg by mouth daily.       . Glucosamine-Chondroit-Biofl-Mn CAPS Take 1 capsule by mouth 2 (two) times daily.      Marland Kitchen lisinopril (PRINIVIL,ZESTRIL) 10 MG tablet Take 1 tablet (10 mg total) by mouth daily.  30 tablet  1  . mupirocin ointment (BACTROBAN) 2 % Apply 1 application topically as needed.      . potassium chloride (K-DUR) 10 MEQ tablet Take 20 mEq by mouth daily.       . vitamin B-12 (CYANOCOBALAMIN) 1000 MCG tablet Take 1,000 mcg by mouth daily.      Marland Kitchen zolpidem (AMBIEN) 5 MG tablet Take 5 mg by mouth at bedtime as needed for sleep.      . meclizine (ANTIVERT) 25 MG tablet Take 25 mg by mouth 3 (three) times daily as needed.       No current facility-administered medications for this visit.    Past Medical History  Diagnosis Date  . Renal disorder   . Abnormal involuntary movements(781.0)   . Viral infection   . Hypogonadism male   . Fatigue   . Orthostatic lightheadedness   . Acute diarrhea   . Abdominal pain of unknown etiology   .  Osteopenia   . Microalbuminuria   . Left bundle branch block   . Atrioventricular block, first degree   . Aortic insufficiency   . Abdominal aortic aneurysm   . History of benign prostatic hypertrophy   . Chronic renal disease, stage III   . Graves disease   . Hyperlipidemia   . Insomnia   . Health maintenance examination   . CLL (chronic lymphoblastic leukemia)   . Heart murmur   . Hypertension   . Arthritis   . Skin cancer of upper limb or shoulder     Right shoulder cancer removal  . Skin cancer of face     nose and forehead  . Heart failure, systolic and diastolic   . Atrial fibrillation   . Presence of permanent cardiac pacemaker 11/17/11    [employer]    Past Surgical History  Procedure Laterality Date  . Hernia repair    . Right foot    .  Eye surgery    . Pacemaker insertion  11/17/2011    [employer]    ZOX:WRUEAVW:UJ colds or fevers, no weight changes- he forgot to bring his log of home weights. Skin:no rashes or ulcers HEENT:no blurred vision, no congestion CV:see HPI PUL:see HPI GI:no diarrhea constipation or melena, no indigestion GU:no hematuria, no dysuria MS:no joint pain, no claudication Neuro:no syncope, no lightheadedness Endo:no diabetes, no thyroid disease  PHYSICAL EXAM BP 104/50  Pulse 72  Ht 5\' 11"  (1.803 m)  Wt 160 lb 6.4 oz (72.757 kg)  BMI 22.38 kg/m2 General:Pleasant affect, NAD Skin:Warm and dry, brisk capillary refill HEENT:normocephalic, sclera clear, mucus membranes moist Neck:supple, no JVD, no bruits  Heart:RRR with soft systolic murmur, no gallup, rub or click Lungs:clear without rales, rhonchi, or wheezes WJX:BJYN, non tender, + BS, do not palpate liver spleen, +pulsitile mass- his AAA. Ext:no lower ext edema, 2+ pedal pulses, 2+ radial pulses Neuro:alert and oriented, MAE, follows commands, + facial symmetry  WGN:FAOZHY fib with 100% pacing.  No acute changes from previous tracing.  ASSESSMENT AND PLAN Chronic combined systolic and diastolic CHF, NYHA class 2 Stable without SOB or edema.  Weight is stable.  Cardiac pacemaker in situ 100% VVI pacing on EKG  AAA (abdominal aortic aneurysm), 5.1 X 4.7 No abd. pain  Chronic lymphoblastic leukemia Followed by Dr. Truett Perna  AF (atrial fibrillation) Stable and rate controlled.  No anticoagulation due to history of severe bleeding.  Unable to take ASA.    He'll follow up with Dr. Royann Shivers in 3 months or if further problems sooner.  Brian Underwood actually what quite well today though and weak and frail. He took a nap while he waited in the room.  He also tells me he is not sleeping well he sleeps for a while and then wakes up.  Please note we believe he takes Lasix a half of 40 mg tab daily. He will call and let us know for sure.

## 2013-05-21 NOTE — Patient Instructions (Signed)
Follow up with Dr. Royann Shivers in 3 months, though if any problems with chest pain or shortness of breath please call.   Call if your weight begins to climb.    Weigh daily Call [phone] if weight climbs more than 3 pounds in a day or 5 pounds in a week. No salt to very little salt in your diet.  No more than 2000 mg in a day. Call if increased shortness of breath or increased swelling.

## 2013-05-21 NOTE — Assessment & Plan Note (Signed)
No abd pain 

## 2013-05-21 NOTE — Assessment & Plan Note (Signed)
Followed by Dr Sherrill.  

## 2013-05-21 NOTE — Assessment & Plan Note (Signed)
Stable without SOB or edema.  Weight is stable.

## 2013-05-21 NOTE — Assessment & Plan Note (Signed)
100% VVI pacing on EKG

## 2013-05-21 NOTE — Assessment & Plan Note (Signed)
Stable and rate controlled.  No anticoagulation due to history of severe bleeding.  Unable to take ASA.

## 2013-05-26 ENCOUNTER — Encounter: Payer: Self-pay | Admitting: Vascular Surgery

## 2013-05-27 ENCOUNTER — Ambulatory Visit (INDEPENDENT_AMBULATORY_CARE_PROVIDER_SITE_OTHER): Payer: Medicare PPO | Admitting: Vascular Surgery

## 2013-05-27 ENCOUNTER — Encounter (INDEPENDENT_AMBULATORY_CARE_PROVIDER_SITE_OTHER): Payer: Medicare PPO | Admitting: *Deleted

## 2013-05-27 ENCOUNTER — Encounter: Payer: Self-pay | Admitting: Vascular Surgery

## 2013-05-27 VITALS — BP 125/63 | HR 72 | Ht 71.0 in | Wt 162.2 lb

## 2013-05-27 DIAGNOSIS — I714 Abdominal aortic aneurysm, without rupture: Secondary | ICD-10-CM

## 2013-05-27 NOTE — Progress Notes (Signed)
The patient has today for continued followup of his asymptomatic abdominal aortic aneurysm. He is alert and oriented. He is 79 years old. He is concerned currently due to his failing health and his wife. They celebrated her 70th whether wedding anniversary 3-4 weeks ago. She had health deterioration following that and was hospitalized and is now in a nursing facility. He remained stable and is able to care for himself.  Past Medical History  Diagnosis Date  . Renal disorder   . Abnormal involuntary movements(781.0)   . Viral infection   . Hypogonadism male   . Fatigue   . Orthostatic lightheadedness   . Acute diarrhea   . Abdominal pain of unknown etiology   . Osteopenia   . Microalbuminuria   . Left bundle branch block   . Atrioventricular block, first degree   . Aortic insufficiency   . Abdominal aortic aneurysm   . History of benign prostatic hypertrophy   . Chronic renal disease, stage III   . Graves disease   . Hyperlipidemia   . Insomnia   . Health maintenance examination   . CLL (chronic lymphoblastic leukemia)   . Heart murmur   . Hypertension   . Arthritis   . Skin cancer of upper limb or shoulder     Right shoulder cancer removal  . Skin cancer of face     nose and forehead  . Heart failure, systolic and diastolic   . Atrial fibrillation   . Presence of permanent cardiac pacemaker 11/17/11    [employer] Jude    History  Substance Use Topics  . Smoking status: Never Smoker   . Smokeless tobacco: Never Used  . Alcohol Use: No    Family History  Problem Relation Age of Onset  . Stroke Mother   . Stroke Father   . Cancer Brother   . Cancer Sister   . Cancer Brother   . Cancer Sister     Allergies  Allergen Reactions  . Aspirin     Nose bleed  . Escitalopram Oxalate   . Ramipril     Current outpatient prescriptions:acetaminophen (TYLENOL) 325 MG tablet, Take 650 mg by mouth 2 (two) times daily. Pain, Disp: , Rfl: ;  Bioflavonoid Products (VITAMIN C PLUS  PO), Take 1 tablet by mouth 2 (two) times daily. , Disp: , Rfl: ;  carvedilol (COREG) 6.25 MG tablet, Take 6.25 mg by mouth 2 (two) times daily with a meal., Disp: , Rfl:  cholecalciferol (VITAMIN D) 1000 UNITS tablet, Take 1,000 Units by mouth 2 (two) times daily. , Disp: , Rfl: ;  furosemide (LASIX) 20 MG tablet, Take 20 mg by mouth daily. , Disp: , Rfl: ;  Glucosamine-Chondroit-Biofl-Mn CAPS, Take 1 capsule by mouth 2 (two) times daily., Disp: , Rfl: ;  lisinopril (PRINIVIL,ZESTRIL) 10 MG tablet, Take 1 tablet (10 mg total) by mouth daily., Disp: 30 tablet, Rfl: 1 meclizine (ANTIVERT) 25 MG tablet, Take 25 mg by mouth 3 (three) times daily as needed., Disp: , Rfl: ;  mupirocin ointment (BACTROBAN) 2 %, Apply 1 application topically as needed., Disp: , Rfl: ;  potassium chloride (K-DUR) 10 MEQ tablet, Take 20 mEq by mouth daily. , Disp: , Rfl: ;  vitamin B-12 (CYANOCOBALAMIN) 1000 MCG tablet, Take 1,000 mcg by mouth daily., Disp: , Rfl:  zolpidem (AMBIEN) 5 MG tablet, Take 5 mg by mouth at bedtime as needed for sleep., Disp: , Rfl:   BP 125/63  Pulse 72  Ht 5\' 11"  (1.803  m)  Wt 162 lb 3.2 oz (73.573 kg)  BMI 22.63 kg/m2  SpO2 98%  Body mass index is 22.63 kg/(m^2).       Physical exam alert oriented well-developed gentleman in no acute distress Respirations are nonlabored Neurologically he is grossly intact Abdominal exam reveals no tenderness. He has an easily palpable aortic aneurysm  Ultrasound today has technical difficulties due to multiple large renal cysts. He does have a very tortuous aorta. Maximal diameter suggestion day is possibly as large as 7.0 cm. This is changed from his most recent ultrasound in our office but this was a technically difficult as well  I reviewed his prior studies. I am able to see his CT scan from April of 2012. That time he had very tortuous aneurysm that began at the level of renal arteries. I have a report of a CT scan from one year ago suggesting size  at 7 cm again the 2 tortuosity.  Impression and plan: Asymptomatic juxtarenal aneurysm. Probably has not had any significant growth with large variability interpretation of his CT and ultrasound over the past 2 years. I explained that he would require open repair since he is not a stent graft candidate. I explained that certainly would be a huge magnitude of operation for him and that he may not return to his baseline. I have recommended that we discontinue surveillance since I would not recommend open surgery due to his advanced age. He is comfortable with this. I did discuss signs of leaking aneurysm and he knows to report immediately to the emergency room should this occur. I did explain that his chance for survival with emergency surgery would be less than 20%.

## 2013-06-11 ENCOUNTER — Other Ambulatory Visit: Payer: Self-pay | Admitting: Cardiovascular Disease

## 2013-06-11 NOTE — Telephone Encounter (Signed)
Rx was sent to pharmacy electronically. 

## 2013-07-14 ENCOUNTER — Ambulatory Visit: Payer: Medicare PPO

## 2013-07-19 ENCOUNTER — Encounter: Payer: Self-pay | Admitting: *Deleted

## 2013-07-19 LAB — REMOTE PACEMAKER DEVICE
BATTERY VOLTAGE: 2.99 V
VENTRICULAR PACING PM: 99

## 2013-08-07 ENCOUNTER — Ambulatory Visit (INDEPENDENT_AMBULATORY_CARE_PROVIDER_SITE_OTHER): Payer: Medicare PPO | Admitting: Cardiovascular Disease

## 2013-08-07 ENCOUNTER — Encounter: Payer: Self-pay | Admitting: Cardiovascular Disease

## 2013-08-07 VITALS — BP 128/66 | HR 71 | Ht 72.0 in | Wt 167.5 lb

## 2013-08-07 DIAGNOSIS — I4891 Unspecified atrial fibrillation: Secondary | ICD-10-CM

## 2013-08-07 DIAGNOSIS — I714 Abdominal aortic aneurysm, without rupture: Secondary | ICD-10-CM

## 2013-08-07 DIAGNOSIS — I509 Heart failure, unspecified: Secondary | ICD-10-CM

## 2013-08-07 DIAGNOSIS — I5042 Chronic combined systolic (congestive) and diastolic (congestive) heart failure: Secondary | ICD-10-CM

## 2013-08-07 DIAGNOSIS — Z950 Presence of cardiac pacemaker: Secondary | ICD-10-CM

## 2013-08-07 NOTE — Assessment & Plan Note (Signed)
Normal remote pacemaker check on September 21.  100% ventricular paced rhythm on background of permanent atrial fibrillation. [employer] accent DR RF model PM 2210, VVIR programming

## 2013-08-07 NOTE — Assessment & Plan Note (Signed)
Large abdominal aortic aneurysm managed conservatively. He does not want surgery or percutaneous exclusion.

## 2013-08-07 NOTE — Assessment & Plan Note (Addendum)
Today appears to be well compensated, euvolemic and NYHA functional class 1-2. No change in current diuretic regimen. LVEF approx 30%. Suspected underlying CAD, but as he does not want invasive management, no coronary evaluation has been performed. Not a candidate for revascularization due to bleeding complications with antiplatelet therapy.

## 2013-08-07 NOTE — Patient Instructions (Signed)
Your physician recommends that you weigh, daily, at the same time every day, and in the same amount of clothing. Please record your daily weights on the handout provided and bring it to your next appointment.   Your physician recommends that you schedule a follow-up appointment in: 6 months

## 2013-08-07 NOTE — Assessment & Plan Note (Signed)
Permanent atrial fibrillation. Not on anticoagulation therapy due to history of severe bleeding. He is unable to take aspirin since he has had severe epistaxis requiring blood transfusion even on low-dose aspirin.

## 2013-08-07 NOTE — Progress Notes (Signed)
Patient ID: [name], male   DOB: 1918-07-08, 79 y.o.   MRN: [id number]      Reason for office visit Congestive heart failure, atrial fibrillation, and the heart block, pacemaker  [name] is generally feeling quite well. He has not been troubled by lower extremity edema, dyspnea or chest discomfort. He denies any recent bleeding problems. He has not had syncope. His weight on his home scale since around 155 pounds +/ -2-3 pounds. He seems to be a 10 pound difference when we measure his weight here in the office. Today he weighs 167 lb, which is roughly 5 pounds more than in August. He does not have any swelling today. Currently his biggest concern is the progressive dementia which is seriously limiting his wife's Mining engineer) ability to live independently. She is currently back in a rehabilitation facility.   Allergies  Allergen Reactions  . Aspirin     Nose bleed  . Escitalopram Oxalate   . Ramipril     Current Outpatient Prescriptions  Medication Sig Dispense Refill  . acetaminophen (TYLENOL) 325 MG tablet Take 650 mg by mouth 2 (two) times daily. Pain      . Bioflavonoid Products (VITAMIN C PLUS PO) Take 1 tablet by mouth 2 (two) times daily.       . carvedilol (COREG) 6.25 MG tablet Take 6.25 mg by mouth 2 (two) times daily with a meal.      . cholecalciferol (VITAMIN D) 1000 UNITS tablet Take 1,000 Units by mouth 2 (two) times daily.       . furosemide (LASIX) 80 MG tablet Take 40 mg by mouth daily.      . Glucosamine-Chondroit-Biofl-Mn CAPS Take 1 capsule by mouth 2 (two) times daily.      . irbesartan (AVAPRO) 150 MG tablet Take 150 mg by mouth at bedtime.      . meclizine (ANTIVERT) 25 MG tablet Take 25 mg by mouth 3 (three) times daily as needed.      . mupirocin ointment (BACTROBAN) 2 % Apply 1 application topically as needed.      . potassium chloride (K-DUR) 10 MEQ tablet Take 20 mEq by mouth daily.       . vitamin B-12 (CYANOCOBALAMIN) 1000 MCG tablet Take 1,000 mcg by  mouth daily.      Marland Kitchen zolpidem (AMBIEN) 5 MG tablet Take 5 mg by mouth at bedtime as needed for sleep.       No current facility-administered medications for this visit.    Past Medical History  Diagnosis Date  . Renal disorder   . Abnormal involuntary movements(781.0)   . Viral infection   . Hypogonadism male   . Fatigue   . Orthostatic lightheadedness   . Acute diarrhea   . Abdominal pain of unknown etiology   . Osteopenia   . Microalbuminuria   . Left bundle branch block   . Atrioventricular block, first degree   . Aortic insufficiency   . Abdominal aortic aneurysm   . History of benign prostatic hypertrophy   . Chronic renal disease, stage III   . Graves disease   . Hyperlipidemia   . Insomnia   . Health maintenance examination   . CLL (chronic lymphoblastic leukemia)   . Heart murmur   . Hypertension   . Arthritis   . Skin cancer of upper limb or shoulder     Right shoulder cancer removal  . Skin cancer of face     nose and forehead  .  Heart failure, systolic and diastolic   . Atrial fibrillation   . Presence of permanent cardiac pacemaker 11/17/11    [employer]    Past Surgical History  Procedure Laterality Date  . Hernia repair    . Right foot    . Eye surgery    . Pacemaker insertion  11/17/2011    [employer]. Jude    Family History  Problem Relation Age of Onset  . Stroke Mother   . Stroke Father   . Cancer Brother   . Cancer Sister   . Cancer Brother   . Cancer Sister     History   Social History  . Marital Status: Married    Spouse Name: N/A    Number of Children: N/A  . Years of Education: N/A   Occupational History  . Not on file.   Social History Main Topics  . Smoking status: Never Smoker   . Smokeless tobacco: Never Used  . Alcohol Use: No  . Drug Use: No  . Sexual Activity: Not on file   Other Topics Concern  . Not on file   Social History Narrative  . No narrative on file    Review of systems: The patient specifically denies  any chest pain at rest or with exertion, dyspnea at rest or with exertion, orthopnea, paroxysmal nocturnal dyspnea, syncope, palpitations, focal neurological deficits, intermittent claudication, lower extremity edema, unexplained weight gain, cough, hemoptysis or wheezing.  The patient also denies abdominal pain, nausea, vomiting, dysphagia, diarrhea, constipation, polyuria, polydipsia, dysuria, hematuria, frequency, urgency, abnormal bleeding or bruising, fever, chills, unexpected weight changes, mood swings, change in skin or hair texture, change in voice quality, auditory or visual problems, allergic reactions or rashes, new musculoskeletal complaints other than usual "aches and pains".   PHYSICAL EXAM BP 128/66  Pulse 71  Ht 6' (1.829 m)  Wt 167 lb 8 oz (75.978 kg)  BMI 22.71 kg/m2  General: Alert, oriented x3, no distress Head: no evidence of trauma, PERRL, EOMI, no exophtalmos or lid lag, no myxedema, no xanthelasma; normal ears, nose and oropharynx Neck: normal jugular venous pulsations and no hepatojugular reflux; brisk carotid pulses without delay and no carotid bruits Chest: clear to auscultation, no signs of consolidation by percussion or palpation, normal fremitus, symmetrical and full respiratory excursions,  Healthy left subclavian pacemaker site Cardiovascular: normal position and quality of the apical impulse, regular rhythm, normal first and paradoxically split second heart sounds, norubs or gallops, grade 2/6 crescendo decrescendo systolic murmur in the aortic focus, grade 2/6 decrescendo diastolic murmur in the aortic focus Abdomen: Pulsatile infraumbilical mass that is nontender to palpation ; no tenderness or distention, no arterial bruits, normal bowel sounds, no hepatosplenomegaly Extremities: no clubbing, cyanosis or edema; 2+ radial, ulnar and brachial pulses bilaterally; 2+ right femoral, posterior tibial and dorsalis pedis pulses; 2+ left femoral, posterior tibial and  dorsalis pedis pulses; no subclavian or femoral bruits Neurological: grossly nonfocal   EKG: Atrial fibrillation, 100% ventricular pacing    BMET    Component Value Date/Time   NA 142 01/01/2013 0545   K 4.3 01/01/2013 0545   CL 103 01/01/2013 0545   CO2 27 01/01/2013 0545   GLUCOSE 95 01/01/2013 0545   BUN 46* 01/01/2013 0545   CREATININE 1.60* 01/01/2013 0545   CALCIUM 8.9 01/01/2013 0545   GFRNONAA 35* 01/01/2013 0545   GFRAA 41* 01/01/2013 0545     ASSESSMENT AND PLAN Chronic combined systolic and diastolic CHF, NYHA class 2 Today appears  to be well compensated, euvolemic and NYHA functional class 1-2. No change in current diuretic regimen. LVEF approx 30%. Suspected underlying CAD, but as he does not want invasive management, no coronary evaluation has been performed. Not a candidate for revascularization due to bleeding complications with antiplatelet therapy.  AF (atrial fibrillation) Permanent atrial fibrillation. Not on anticoagulation therapy due to history of severe bleeding. He is unable to take aspirin since he has had severe epistaxis requiring blood transfusion even on low-dose aspirin.  Cardiac pacemaker in situ Normal remote pacemaker check on September 21.  100% ventricular paced rhythm on background of permanent atrial fibrillation. [employer] Jude [employer] accent DR RF model PM 2210, VVIR programming  Abdominal aneurysm without mention of rupture Large abdominal aortic aneurysm managed conservatively. He does not want surgery or percutaneous exclusion.  Orders Placed This Encounter  Procedures  . EKG 12-Lead   Meds ordered this encounter  Medications  . furosemide (LASIX) 80 MG tablet    Sig: Take 40 mg by mouth daily.  . irbesartan (AVAPRO) 150 MG tablet    Sig: Take 150 mg by mouth at bedtime.    Junious Silk, MD, [facility] [phone] office [phone] pager

## 2013-08-08 ENCOUNTER — Encounter: Payer: Self-pay | Admitting: Cardiovascular Disease

## 2013-09-08 ENCOUNTER — Other Ambulatory Visit: Payer: Self-pay | Admitting: Cardiovascular Disease

## 2013-09-08 ENCOUNTER — Other Ambulatory Visit: Payer: Self-pay | Admitting: Internal Medicine

## 2013-09-08 DIAGNOSIS — M545 Low back pain, unspecified: Secondary | ICD-10-CM

## 2013-09-08 NOTE — Telephone Encounter (Signed)
Rx was sent to pharmacy electronically. 

## 2013-09-10 ENCOUNTER — Ambulatory Visit
Admission: RE | Admit: 2013-09-10 | Discharge: 2013-09-10 | Disposition: A | Discharge status: Home / Self Care | Payer: Commercial Managed Care - HMO | Source: Ambulatory Visit | Attending: Internal Medicine | Admitting: Internal Medicine

## 2013-09-10 ENCOUNTER — Other Ambulatory Visit: Payer: Self-pay | Admitting: Internal Medicine

## 2013-09-10 ENCOUNTER — Other Ambulatory Visit: Payer: Medicare PPO

## 2013-09-10 DIAGNOSIS — M545 Low back pain: Secondary | ICD-10-CM

## 2013-09-12 ENCOUNTER — Other Ambulatory Visit: Payer: Self-pay | Admitting: Internal Medicine

## 2013-09-12 DIAGNOSIS — M545 Low back pain: Secondary | ICD-10-CM

## 2013-09-15 ENCOUNTER — Encounter: Payer: Self-pay | Admitting: Vascular Surgery

## 2013-09-16 ENCOUNTER — Encounter: Payer: Self-pay | Admitting: Vascular Surgery

## 2013-09-16 ENCOUNTER — Ambulatory Visit (INDEPENDENT_AMBULATORY_CARE_PROVIDER_SITE_OTHER): Payer: Medicare PPO | Admitting: Vascular Surgery

## 2013-09-16 VITALS — BP 113/62 | HR 74 | Resp 18 | Ht 71.5 in | Wt 157.1 lb

## 2013-09-16 DIAGNOSIS — I714 Abdominal aortic aneurysm, without rupture: Secondary | ICD-10-CM

## 2013-09-16 NOTE — Progress Notes (Signed)
The patient has today for continued discussion regarding his large infrarenal abdominal aortic aneurysm. My last visit with him in August of 2014 he was found to have a 7 cm aneurysm. He had a very long discussion at that time. He does not appear to be a stent graft candidate and I did explain that the magnitude of open repair for his juxtarenal aneurysm in his age of 18 would be prohibitive. We have elected to not continue to follow this and not attempt treatment.  Over the last one to 2 weeks he has had severe pain in his low back this is positional. He does have known degenerative disc disease. He is able to tolerate this discomfort with around-the-clock narcotic pain medication. He underwent a CT of his lumbar spine and this gave better visualization of his aneurysm.  I did review the CT and discuss it with him. 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 He has extreme tortuosity of his aorta at the level of the renal arteries and does have aneurysmal degeneration his pelvis and iliac arteries as well.  I discussed this at length with the patient. I did explain that he is not a stent graft candidate. I explained that he could have open repair but that that with his advanced age and comorbidities of his morbidity with a procedure would be high and greater concern his ability to return to his usual quality of life would be doubtful. He is comfortable with the decision for no treatment and understands that risk for rupture in that his mortality would be near 100% with ruptured aneurysm.  He is questioning appropriate treatment for his back discomfort. Over this will resolve with temporary use of narcotic pain meds. He will followup with Dr.Perini regarding this.

## 2013-09-22 ENCOUNTER — Ambulatory Visit
Admission: RE | Admit: 2013-09-22 | Discharge: 2013-09-22 | Disposition: A | Discharge status: Home / Self Care | Payer: Medicare HMO | Source: Ambulatory Visit | Attending: Internal Medicine | Admitting: Internal Medicine

## 2013-09-22 DIAGNOSIS — M545 Low back pain: Secondary | ICD-10-CM

## 2013-09-22 MED ORDER — METHYLPREDNISOLONE ACETATE 40 MG/ML INJ SUSP (RADIOLOG
120.0000 mg | Freq: Once | INTRAMUSCULAR | Status: AC
  Administered 2013-09-22: 120 mg via EPIDURAL

## 2013-09-22 MED ORDER — IOHEXOL 180 MG/ML  SOLN
1.0000 mL | Freq: Once | INTRAMUSCULAR | Status: AC | PRN
  Administered 2013-09-22: 1 mL via EPIDURAL

## 2013-09-22 NOTE — Progress Notes (Signed)
Patient in wheelchair following fall backwards onto head and back after LESI.  States LLE still without feeling though he is able to move it.  Complained of headache after "whacking the back of my head on the hard floor," but denies headache at present while sitting here.  Rom May, friend/co-worker/driver for procedure sitting with patient.  jkl

## 2013-09-22 NOTE — Progress Notes (Signed)
Dr. Alfredo Batty in to speak with patient and his friend.  Patient attempted to stand; still has numbness in LLE.  We will wait about another 15 minutes and re-assess.  jkl

## 2013-09-22 NOTE — Progress Notes (Signed)
Dr. Alfredo Batty in to speak with patient.  LLE "much better" but escorted to car in wheelchair.  jkl

## 2013-10-12 LAB — PACEMAKER DEVICE OBSERVATION

## 2013-10-13 ENCOUNTER — Ambulatory Visit (INDEPENDENT_AMBULATORY_CARE_PROVIDER_SITE_OTHER): Payer: Medicare HMO | Admitting: *Deleted

## 2013-10-13 DIAGNOSIS — I447 Left bundle-branch block, unspecified: Secondary | ICD-10-CM

## 2013-10-13 DIAGNOSIS — I4891 Unspecified atrial fibrillation: Secondary | ICD-10-CM

## 2013-10-13 DIAGNOSIS — I498 Other specified cardiac arrhythmias: Secondary | ICD-10-CM

## 2013-10-13 DIAGNOSIS — R001 Bradycardia, unspecified: Secondary | ICD-10-CM

## 2013-10-13 DIAGNOSIS — I5042 Chronic combined systolic (congestive) and diastolic (congestive) heart failure: Secondary | ICD-10-CM

## 2013-10-13 DIAGNOSIS — I509 Heart failure, unspecified: Secondary | ICD-10-CM

## 2013-10-13 LAB — MDC_IDC_ENUM_SESS_TYPE_REMOTE
Battery Voltage: 2.98 V
Implantable Pulse Generator Serial Number: 7304978
Lead Channel Setting Sensing Sensitivity: 2 mV
MDC IDC MSMT LEADCHNL RV IMPEDANCE VALUE: 410 Ohm
MDC IDC MSMT LEADCHNL RV PACING THRESHOLD AMPLITUDE: 0.875 V
MDC IDC MSMT LEADCHNL RV PACING THRESHOLD PULSEWIDTH: 0.5 ms
MDC IDC MSMT LEADCHNL RV SENSING INTR AMPL: 5.5 mV
MDC IDC SET LEADCHNL RV PACING AMPLITUDE: 1.25 V
MDC IDC SET LEADCHNL RV PACING PULSEWIDTH: 0.5 ms
MDC IDC STAT BRADY RV PERCENT PACED: 99 % — AB

## 2013-10-24 IMAGING — CT CT CHEST W/O CM
3 of 4 series · 17 of 30 positions shown, 19 images · non-contrast
Comparison: Chest x-ray 12/30/2012.

CLINICAL DATA: Evaluate pleural effusion.

CT CHEST WITHOUT CONTRAST
TECHNIQUE: Multidetector CT imaging of the chest was performed
following the standard protocol without IV contrast.

[Series 2: routine chest · axial · 0.87mm/px · z∈[-290,-65]mm · 5 of 75 slices shown, 7 images]
[im 15/75  mediastinal]
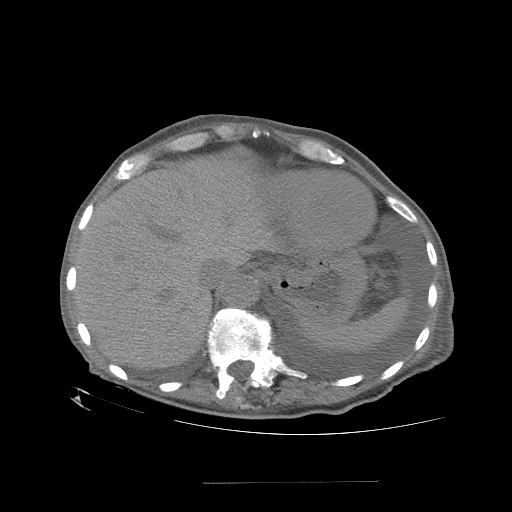
[im 15/75  lung]
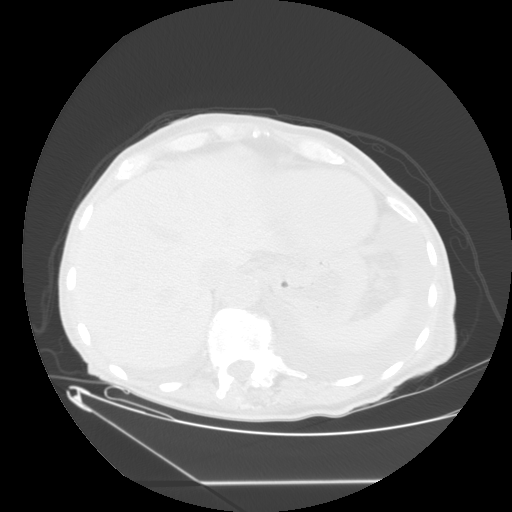
[im 30/75  lung]
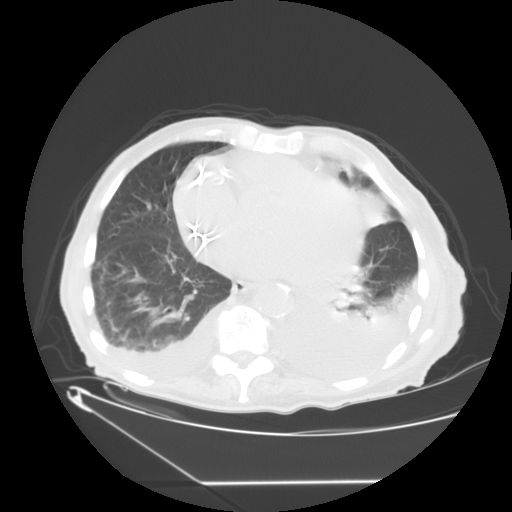
[im 40/75  lung]
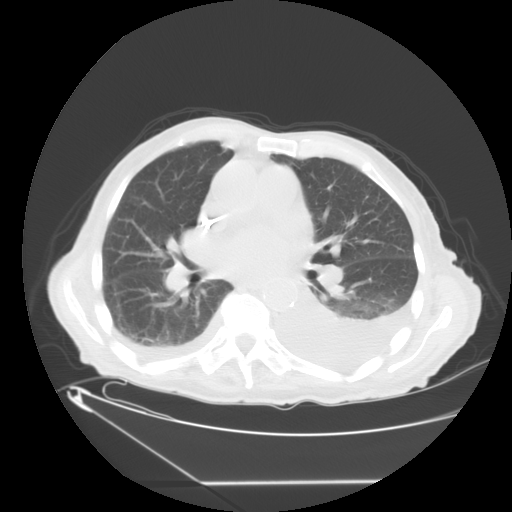
[im 45/75  lung]
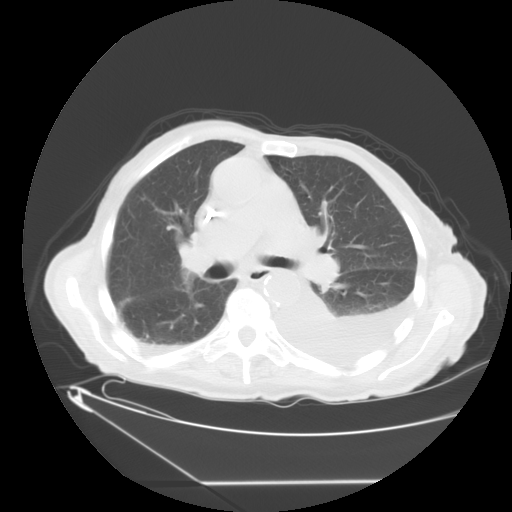
[im 60/75  mediastinal]
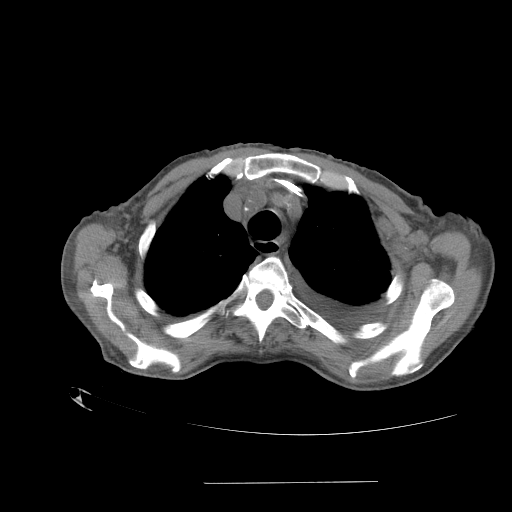
[im 60/75  lung]
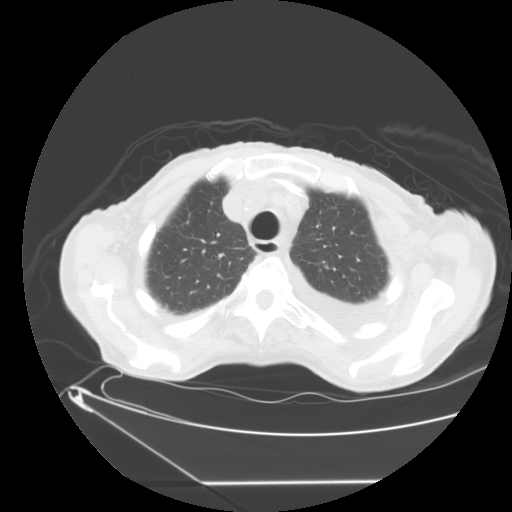

[Series 3: recon 2: routine chest · axial · 0.87mm/px · z∈[-220,-70]mm · 4 of 62 slices shown]
[im 16/62  lung]
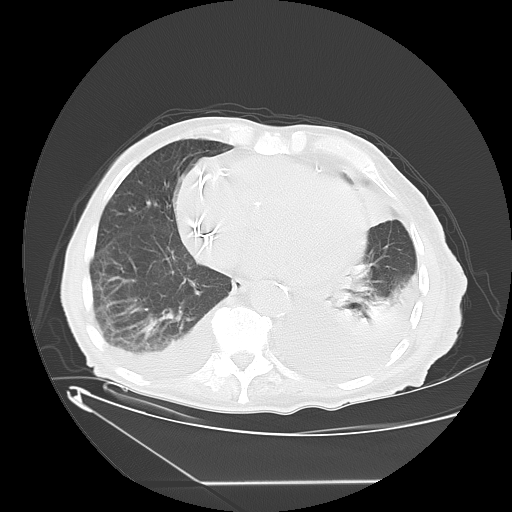
[im 27/62  lung]
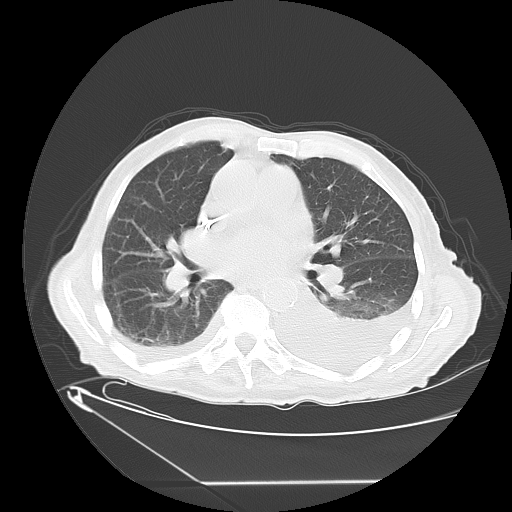
[im 31/62  lung]
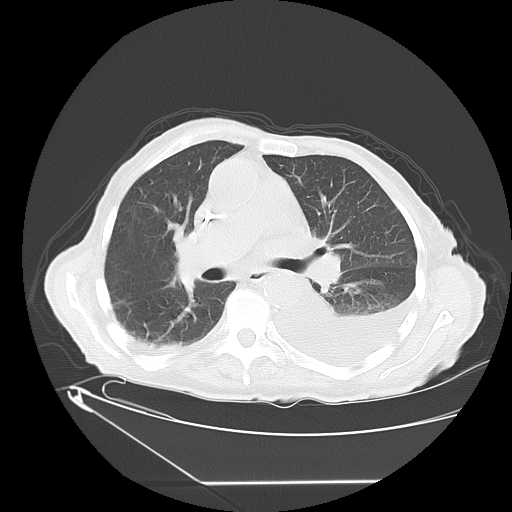
[im 46/62  lung]
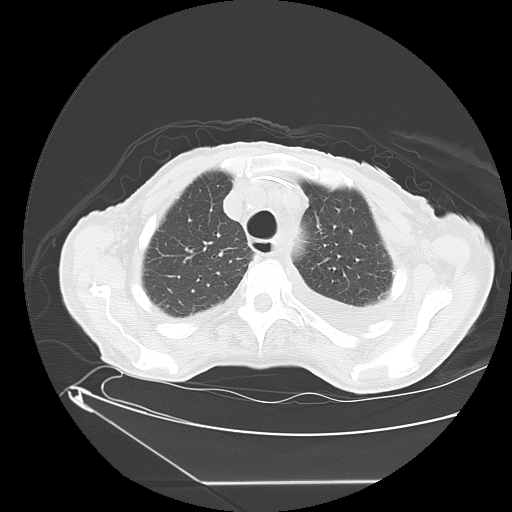

[Series 401: sag · sagittal · 0.87mm/px · 8 of 128 slices shown]
[im 15/128  lung]
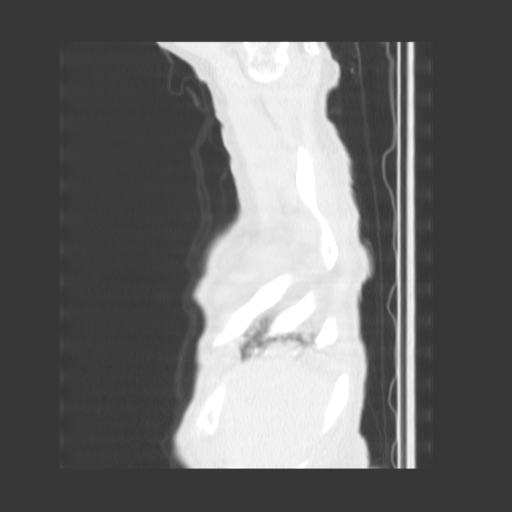
[im 29/128  lung]
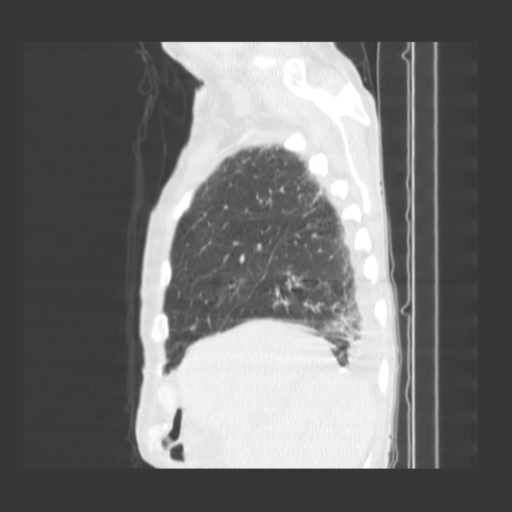
[im 43/128  lung]
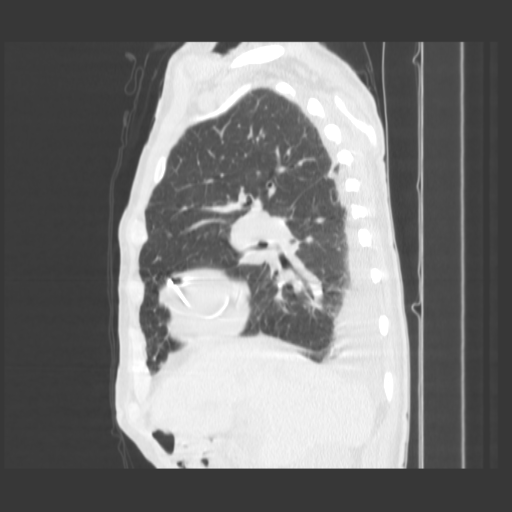
[im 57/128  lung]
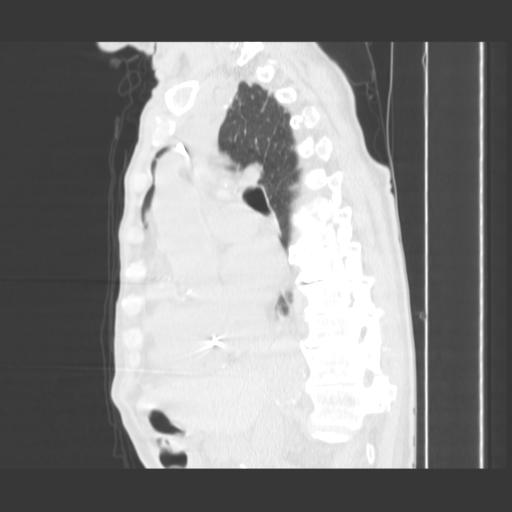
[im 71/128  lung]
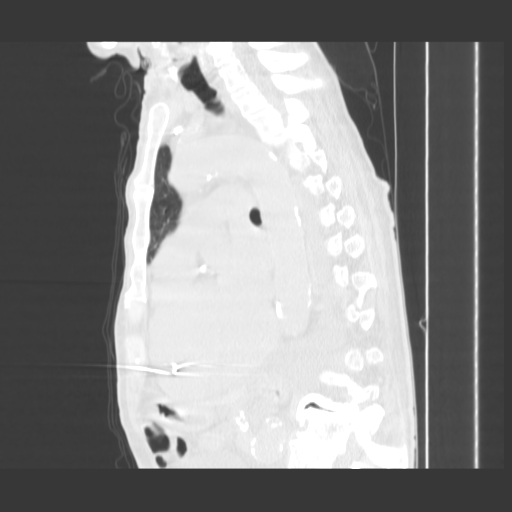
[im 85/128  lung]
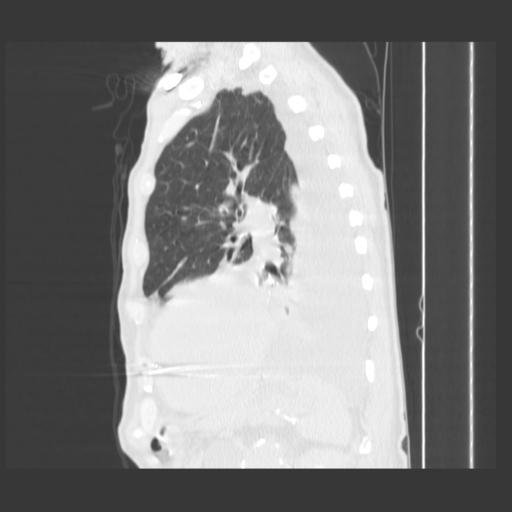
[im 99/128  lung]
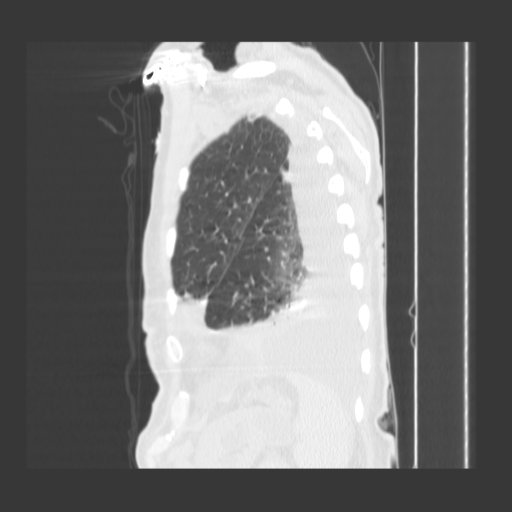
[im 113/128  lung]
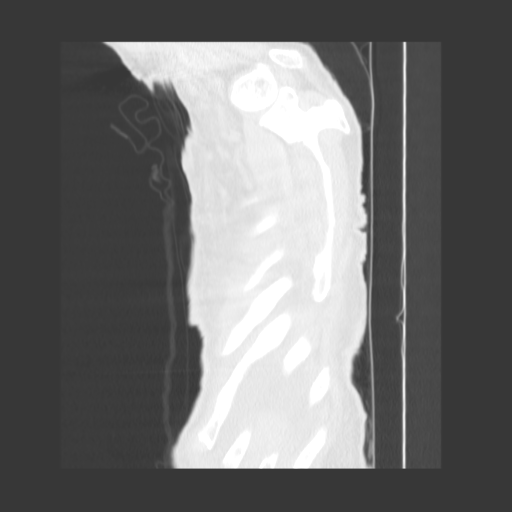

[17 of 30 positions shown; findings below may reference images not displayed]

FINDINGS: There is a moderate-to-large left pleural effusion with
small to moderate right pleural effusion.  Compressive atelectasis
in the left lower lobe.  Airspace disease in the right lower lobe
could reflect atelectasis or infiltrate.  Probable lingular
scarring or atelectasis.

There is cardiomegaly.  Left pacer in place with leads in the right
atrium and right ventricle.  Diffuse coronary artery
calcifications.  Aorta is ectatic, with maximum diameter in the
aortic arch of 3.9 cm. No visible mediastinal, hilar or axillary
adenopathy.

On the lowest image within the upper abdomen, the aorta swings
markedly to the left, displacing the left kidney anteriorly and is
aneurysmal measuring up to 5 cm.  I cannot exclude that this
enlarges further more inferiorly.  Cystic areas within the left
kidney which cannot be fully characterize without intravenous
contrast.

Severe degenerative changes in the lower thoracic and upper lumbar
spine.
IMPRESSION: A moderate-to-large left pleural effusion with small to moderate
right pleural effusion.  Compressive atelectasis in the left lower
lobe.  Patchy opacity in the right lung base could represent
atelectasis or infiltrate.  Lingular atelectasis or scarring.

5.0 cm abdominal aortic aneurysm on the lowest image within the
abdomen.  I cannot exclude that this enlarges further more
inferiorly.

## 2013-10-31 ENCOUNTER — Encounter: Payer: Self-pay | Admitting: *Deleted

## 2014-01-20 ENCOUNTER — Telehealth: Payer: Self-pay | Admitting: Cardiovascular Disease

## 2014-02-05 ENCOUNTER — Encounter: Payer: Self-pay | Admitting: *Deleted

## 2014-02-05 ENCOUNTER — Encounter: Payer: Medicare HMO | Admitting: Cardiovascular Disease

## 2014-02-12 NOTE — Telephone Encounter (Signed)
Closed encounter °

## 2014-02-25 ENCOUNTER — Encounter: Payer: Self-pay | Admitting: Cardiovascular Disease

## 2014-02-25 ENCOUNTER — Ambulatory Visit (INDEPENDENT_AMBULATORY_CARE_PROVIDER_SITE_OTHER): Payer: Commercial Managed Care - HMO | Admitting: Cardiovascular Disease

## 2014-02-25 VITALS — BP 116/74 | HR 70 | Resp 16 | Ht 72.0 in | Wt 168.2 lb

## 2014-02-25 DIAGNOSIS — I4891 Unspecified atrial fibrillation: Secondary | ICD-10-CM

## 2014-02-25 DIAGNOSIS — R001 Bradycardia, unspecified: Secondary | ICD-10-CM

## 2014-02-25 DIAGNOSIS — I498 Other specified cardiac arrhythmias: Secondary | ICD-10-CM

## 2014-02-25 DIAGNOSIS — I5042 Chronic combined systolic (congestive) and diastolic (congestive) heart failure: Secondary | ICD-10-CM

## 2014-02-25 LAB — PACEMAKER DEVICE OBSERVATION

## 2014-02-25 NOTE — Progress Notes (Signed)
Patient ID: Brian Underwood, male   DOB: 1918/07/30, 80 y.o.   MRN: [id number]      Reason for office visit Congestive heart failure, atrial fibrillation, pacemaker followup  Brian Underwood is not had any serious problems since his last office appointment. He has been very careful with sodium intake. He has mastered the ability to titrate his diuretic dose. He infrequently has to increase the furosemide to a total of 80 mg a day from his usual 40 mg a day, when he develops mild lower extremity edema, mild exertional dyspnea and a weight gain of 4-5 pounds. He has only had to do this 2 or 3 times this year. The increased dose of diuretic prompting leads to resolution of the weight gain and symptoms.  He is unable to take anticoagulation or even aspirin for stroke prevention secondary to recurrent epistaxis which he describes as being extremely severe. He has not had any severe episodes of epistaxis so far this year. He also denies any focal neurological deficits or other episodes that might be considered embolic.  Brian Underwood is an elderly gentleman with mildly depressed left ventricular systolic function, suspected CAD, contractile asynchrony related to left bundle branch block and right ventricular pacing, permanent atrial fibrillation, aortic valve insufficiency,a history of severe epistaxis and a very large abdominal aortic aneurysm as well as numerous noncardiac comorbidity such as chronic lymphocytic leukemia and chronic kidney disease stage III. When he has episodes of heart failure exacerbation there is often an unusual worsening of pseudo-bulbar symptoms with uncontrolled yelling. He has occasional ventricular sensed beats but for the most part has 100% ventricular pacing and should be considered to have complete heart block and pacemaker dependency.    Allergies  Allergen Reactions  . Aspirin Other (See Comments)    Nose bleed    Current Outpatient Prescriptions  Medication Sig Dispense  Refill  . acetaminophen (TYLENOL) 325 MG tablet Take 650 mg by mouth 2 (two) times daily. Pain      . Bioflavonoid Products (VITAMIN C PLUS PO) Take 1 tablet by mouth 2 (two) times daily.       . carvedilol (COREG) 6.25 MG tablet TAKE 1 TABLET BY MOUTH TWICE DAILY  60 tablet  10  . cholecalciferol (VITAMIN D) 1000 UNITS tablet Take 1,000 Units by mouth 2 (two) times daily.       . furosemide (LASIX) 80 MG tablet Take 40 mg by mouth daily.      . Glucosamine-Chondroit-Biofl-Mn CAPS Take 1 capsule by mouth 2 (two) times daily.      . irbesartan (AVAPRO) 150 MG tablet TAKE 1 TABLET BY MOUTH EVERY DAY  30 tablet  10  . meclizine (ANTIVERT) 25 MG tablet Take 25 mg by mouth 3 (three) times daily as needed.      . mupirocin ointment (BACTROBAN) 2 % Apply 1 application topically as needed.      . potassium chloride (K-DUR) 10 MEQ tablet Take 20 mEq by mouth daily.       . vitamin B-12 (CYANOCOBALAMIN) 1000 MCG tablet Take 1,000 mcg by mouth daily.      Marland Kitchen zolpidem (AMBIEN) 5 MG tablet Take 5 mg by mouth at bedtime as needed for sleep.       No current facility-administered medications for this visit.    Past Medical History  Diagnosis Date  . Renal disorder   . Abnormal involuntary movements(781.0)   . Viral infection   . Hypogonadism male   . Fatigue   .  Orthostatic lightheadedness   . Acute diarrhea   . Abdominal pain of unknown etiology   . Osteopenia   . Microalbuminuria   . Left bundle branch block   . Atrioventricular block, first degree   . Aortic insufficiency   . Abdominal aortic aneurysm   . History of benign prostatic hypertrophy   . Chronic renal disease, stage III   . Graves disease   . Hyperlipidemia   . Insomnia   . Health maintenance examination   . CLL (chronic lymphoblastic leukemia)   . Heart murmur   . Hypertension   . Arthritis   . Skin cancer of upper limb or shoulder     Right shoulder cancer removal  . Skin cancer of face     nose and forehead  . Heart  failure, systolic and diastolic   . Atrial fibrillation   . Presence of permanent cardiac pacemaker 11/17/11    [employer] Jude    Past Surgical History  Procedure Laterality Date  . Hernia repair    . Right foot    . Eye surgery    . Pacemaker insertion  11/17/2011    [employer]. Jude    Family History  Problem Relation Age of Onset  . Stroke Mother   . Stroke Father   . Cancer Brother   . Cancer Sister   . Cancer Brother   . Cancer Sister     History   Social History  . Marital Status: Married    Spouse Name: N/A    Number of Children: N/A  . Years of Education: N/A   Occupational History  . Not on file.   Social History Main Topics  . Smoking status: Never Smoker   . Smokeless tobacco: Never Used  . Alcohol Use: No  . Drug Use: No  . Sexual Activity: Not on file   Other Topics Concern  . Not on file   Social History Narrative  . No narrative on file    Review of systems: The patient specifically denies any chest pain at rest or with exertion, dyspnea at rest, orthopnea, paroxysmal nocturnal dyspnea, syncope, palpitations, new focal neurological deficits, intermittent claudication, unexplained weight gain, cough, hemoptysis or wheezing.  He has NYHA functional class II dyspnea on exertion and occasional ankle edema, none today. The patient also denies abdominal pain, nausea, vomiting, dysphagia, diarrhea, constipation, polyuria, polydipsia, dysuria, hematuria, frequency, urgency, abnormal bleeding or bruising, fever, chills, unexpected weight changes, mood swings, change in skin or hair texture, change in voice quality, auditory or visual problems, allergic reactions or rashes, new musculoskeletal complaints other than usual "aches and pains".   PHYSICAL EXAM BP 116/74  Pulse 70  Resp 16  Ht 6' (1.829 m)  Wt 168 lb 3.2 oz (76.295 kg)  BMI 22.81 kg/m2 General: Alert, oriented x3, no distress; appears elderly and frail  Head: no evidence of trauma, PERRL, EOMI, no  exophtalmos or lid lag, no myxedema, no xanthelasma; normal ears, nose and oropharynx  Neck: normal jugular venous pulsations and no hepatojugular reflux; brisk carotid pulses without delay and no carotid bruits  Chest: clear to auscultation, no signs of consolidation by percussion or palpation, normal fremitus, symmetrical and full respiratory excursions  Cardiovascular: normal position and quality of the apical impulse, regular rhythm, normal first and paradoxically split second heart sound, grade 1/6 decrescendo aortic murmur at the right lower sternal border and 2/6 aortic ejection murmur, no rubs or gallops  Abdomen: no tenderness or distention, no masses by  palpation, pulsatile pelvic mass is felt , scattered abdominal systolic arterial bruits, normal bowel sounds, no hepatosplenomegaly  Extremities: no clubbing, cyanosis; 1-2+ ankle edema; 2+ radial, ulnar and brachial pulses bilaterally; 2+ right femoral, posterior tibial and dorsalis pedis pulses; 2+ left femoral, posterior tibial and dorsalis pedis pulses; no subclavian or femoral bruits  Neurological: grossly nonfocal, slow slightly unsteady gait   EKG: Background atrial fibrillation with 100% ventricular pacing   BMET    Component Value Date/Time   NA 142 01/01/2013 0545   K 4.3 01/01/2013 0545   CL 103 01/01/2013 0545   CO2 27 01/01/2013 0545   GLUCOSE 95 01/01/2013 0545   BUN 46* 01/01/2013 0545   CREATININE 1.60* 01/01/2013 0545   CALCIUM 8.9 01/01/2013 0545   GFRNONAA 35* 01/01/2013 0545   GFRAA 41* 01/01/2013 0545     ASSESSMENT AND PLAN Systolic and diastolic CHF,  chronic  He is suspected to have ischemic cardiomyopathy, but do to a desire to avoid any invasive or contrast based therapies he has never undergone coronary angiography. Part of his depressed systolic function may also be related to asynchrony in the setting of right ventricular pacing. Currently appears to be NYHA functional class II and close to euvolemic status.  He has no leg edema and he's not short of breath. We'll maintain the same diuretic regimen. Note presence of chronic renal insufficiency and need for judicious management of volemia. He is unable to take ACE inhibitor/ARB therapy secondary to renal insufficiency and hypotension. For the same reason the dose of carvedilol has not been escalated further.  AF (atrial fibrillation)  Permanent atrial fibrillation. Not on anticoagulation therapy due to history of severe bleeding. He is unable to take aspirin since he has had severe epistaxis requiring blood transfusion even on low-dose aspirin. He has a history of intermittent high-grade atrioventricular block and a single chamber permanent pacemaker. He now appears to be pacemaker dependent. CKD (chronic kidney disease), stage III  Baseline serum creatinine around 1.4, corresponding to a creatinine clearance of roughly 40 mL per minute  Cardiac pacemaker in situ  [employer] Jude Medical Accent DR RF model PM 2210 implanted in January 2013 for symptomatic bradycardia. The device is functioning normally by today's check. RV pacing may be contributing to heart failure, but he does not wish, nor is he an appropriate candidate for resynchronization therapy. Also note that he has an underlying left bundle branch block and even when not right ventricular paced is likely to have asynchrony.  AAA (abdominal aortic aneurysm), 5.1 X 4.7  Large aneurysm followed by Dr. Oneida Alar. He has requested no invasive/aggressive interventions.  Chronic lymphoblastic leukemia  Followed by Dr. Benay Spice, associated with minimal anemia and mild thrombocytopenia, not currently on therapy  AI (aortic insufficiency)  Possibly contributing to congestive heart failure. Not a candidate for vasodilator therapy due to hypotension. Previously on angiotensin receptor blockers; these had to be discontinued.   Patient Instructions  Remote monitoring is used to monitor your Pacemaker of ICD from home.  This monitoring reduces the number of office visits required to check your device to one time per year. It allows [place] to keep an eye on the functioning of your device to ensure it is working properly. You are scheduled for a device check from home on 06/01/2014. You may send your transmission at any time that day. If you have a wireless device, the transmission will be sent automatically. After your physician reviews your transmission, you will receive a postcard with  your next transmission date.  Dr Sallyanne Kuster wants you to follow-up in 6 months. You will receive a reminder letter in the mail one months in advance. If you don't receive a letter, please call our office to schedule the follow-up appointment.    Orders Placed This Encounter  Procedures  . EKG 12-Lead   No orders of the defined types were placed in this encounter.    Tyquez Hollibaugh  Sanda Klein, MD, [facility] [phone] office [phone] pager

## 2014-02-25 NOTE — Patient Instructions (Signed)
Remote monitoring is used to monitor your Pacemaker of ICD from home. This monitoring reduces the number of office visits required to check your device to one time per year. It allows [place] to keep an eye on the functioning of your device to ensure it is working properly. You are scheduled for a device check from home on 06/01/2014. You may send your transmission at any time that day. If you have a wireless device, the transmission will be sent automatically. After your physician reviews your transmission, you will receive a postcard with your next transmission date.  Dr Sallyanne Kuster wants you to follow-up in 6 months. You will receive a reminder letter in the mail one months in advance. If you don't receive a letter, please call our office to schedule the follow-up appointment.

## 2014-03-02 LAB — MDC_IDC_ENUM_SESS_TYPE_INCLINIC
Brady Statistic RV Percent Paced: 99 %
Lead Channel Pacing Threshold Amplitude: 1 V
Lead Channel Setting Pacing Pulse Width: 0.5 ms
Lead Channel Setting Sensing Sensitivity: 2 mV
MDC IDC MSMT BATTERY VOLTAGE: 2.98 V
MDC IDC MSMT LEADCHNL RV IMPEDANCE VALUE: 390 Ohm
MDC IDC MSMT LEADCHNL RV PACING THRESHOLD PULSEWIDTH: 0.5 ms
MDC IDC PG SERIAL: 7304978
MDC IDC SET LEADCHNL RV PACING AMPLITUDE: 1.25 V

## 2014-03-24 ENCOUNTER — Telehealth: Payer: Self-pay | Admitting: Physician Assistant

## 2014-03-24 ENCOUNTER — Ambulatory Visit (INDEPENDENT_AMBULATORY_CARE_PROVIDER_SITE_OTHER): Payer: Commercial Managed Care - HMO | Admitting: *Deleted

## 2014-03-24 DIAGNOSIS — Z950 Presence of cardiac pacemaker: Secondary | ICD-10-CM

## 2014-03-24 LAB — MDC_IDC_ENUM_SESS_TYPE_INCLINIC
Battery Voltage: 2.99 V
Brady Statistic RA Percent Paced: 0 %
Brady Statistic RV Percent Paced: 99.34 %
Lead Channel Impedance Value: 437.5 Ohm
Lead Channel Pacing Threshold Amplitude: 1 V
Lead Channel Sensing Intrinsic Amplitude: 12 mV
Lead Channel Sensing Intrinsic Amplitude: 3.4 mV
Lead Channel Setting Pacing Amplitude: 1.125
Lead Channel Setting Pacing Pulse Width: 0.5 ms
MDC IDC MSMT BATTERY REMAINING LONGEVITY: 132 mo
MDC IDC MSMT LEADCHNL RV PACING THRESHOLD AMPLITUDE: 1 V
MDC IDC MSMT LEADCHNL RV PACING THRESHOLD PULSEWIDTH: 0.5 ms
MDC IDC MSMT LEADCHNL RV PACING THRESHOLD PULSEWIDTH: 0.5 ms
MDC IDC PG SERIAL: 7304978
MDC IDC SESS DTM: 20150602100458
MDC IDC SET LEADCHNL RV SENSING SENSITIVITY: 2 mV

## 2014-03-24 NOTE — Progress Notes (Signed)
Pt c/o of bradycardia due to taking pulse at home. Pacemaker check in clinic. Normal device function. Thresholds, sensing, impedances consistent with previous measurements. Device programmed to maximize longevity. No high ventricular rates noted. Device programmed at appropriate safety margins. Histogram distribution appropriate for patient activity level. Device programmed to optimize intrinsic conduction. Estimated longevity 10.9-11.84yrs. Follow up as planned--[place] 06/01/14 & ROV w/ Dr. Sallyanne Kuster in 67mo.

## 2014-03-24 NOTE — Telephone Encounter (Signed)
The patient called the answering service with a question about his pacemaker. He did not sleep well last night. He is concerned because he cannot feel a heartbeat or pulse on himself this morning. He denies CP, SOB, lightheadedness, syncope, or any other acute symptoms. He has a home monitor but does not know how to send in a transmission - he says it routinely checks his device automatically. Discussed with Chanetta Marshall, RN - will have pt come by the [facility] office for device check. Pt aware of location address. His friend will be driving him up to the office shortly. Gudelia Eugene PA-C

## 2014-04-10 ENCOUNTER — Encounter: Payer: Self-pay | Admitting: Cardiovascular Disease

## 2014-06-01 ENCOUNTER — Ambulatory Visit (INDEPENDENT_AMBULATORY_CARE_PROVIDER_SITE_OTHER): Payer: Commercial Managed Care - HMO | Admitting: *Deleted

## 2014-06-01 DIAGNOSIS — I498 Other specified cardiac arrhythmias: Secondary | ICD-10-CM

## 2014-06-01 DIAGNOSIS — R001 Bradycardia, unspecified: Secondary | ICD-10-CM

## 2014-06-01 LAB — MDC_IDC_ENUM_SESS_TYPE_REMOTE
Battery Remaining Longevity: 121 mo
Battery Remaining Percentage: 91 %
Date Time Interrogation Session: 20150810140016
Implantable Pulse Generator Model: 2210
Implantable Pulse Generator Serial Number: 7304978
Lead Channel Pacing Threshold Amplitude: 0.875 V
Lead Channel Pacing Threshold Pulse Width: 0.5 ms
Lead Channel Setting Sensing Sensitivity: 2 mV
MDC IDC MSMT BATTERY VOLTAGE: 2.98 V
MDC IDC MSMT LEADCHNL RV IMPEDANCE VALUE: 410 Ohm
MDC IDC MSMT LEADCHNL RV SENSING INTR AMPL: 4.7 mV
MDC IDC SET LEADCHNL RV PACING AMPLITUDE: 1.125
MDC IDC SET LEADCHNL RV PACING PULSEWIDTH: 0.5 ms
MDC IDC STAT BRADY RV PERCENT PACED: 99 %

## 2014-06-01 NOTE — Progress Notes (Signed)
Remote pacemaker transmission.   

## 2014-06-06 ENCOUNTER — Other Ambulatory Visit: Payer: Self-pay | Admitting: Cardiovascular Disease

## 2014-06-09 ENCOUNTER — Other Ambulatory Visit: Payer: Self-pay | Admitting: *Deleted

## 2014-06-10 NOTE — Telephone Encounter (Signed)
No answer from patient, Rx refill denied. Records state that Rx was D/C'd back on 08/07/2013.

## 2014-06-16 ENCOUNTER — Encounter: Payer: Self-pay | Admitting: Cardiology

## 2014-06-22 ENCOUNTER — Encounter: Payer: Self-pay | Admitting: Cardiovascular Disease

## 2014-07-23 DEATH — deceased

## 2014-08-25 ENCOUNTER — Encounter: Payer: Commercial Managed Care - HMO | Admitting: Cardiovascular Disease

## 2014-09-16 ENCOUNTER — Encounter: Payer: Self-pay | Admitting: *Deleted

## 2014-10-01 ENCOUNTER — Encounter (HOSPITAL_COMMUNITY): Payer: Self-pay | Admitting: Cardiovascular Disease

## 2014-10-20 ENCOUNTER — Encounter: Payer: Self-pay | Admitting: *Deleted

## 2014-11-20 ENCOUNTER — Encounter: Payer: Self-pay | Admitting: *Deleted
